# Patient Record
Sex: Female | Born: 1945
Health system: Southern US, Community
[De-identification: ages and names within clinical notes are randomized; demographics above are authoritative.]

## PROBLEM LIST (undated history)

## (undated) DIAGNOSIS — E785 Hyperlipidemia, unspecified: Secondary | ICD-10-CM

## (undated) DIAGNOSIS — R739 Hyperglycemia, unspecified: Secondary | ICD-10-CM

## (undated) DIAGNOSIS — K219 Gastro-esophageal reflux disease without esophagitis: Secondary | ICD-10-CM

## (undated) DIAGNOSIS — R9431 Abnormal electrocardiogram [ECG] [EKG]: Secondary | ICD-10-CM

## (undated) DIAGNOSIS — I2510 Atherosclerotic heart disease of native coronary artery without angina pectoris: Secondary | ICD-10-CM

## (undated) DIAGNOSIS — I10 Essential (primary) hypertension: Secondary | ICD-10-CM

## (undated) DIAGNOSIS — M545 Low back pain: Secondary | ICD-10-CM

## (undated) DIAGNOSIS — I213 ST elevation (STEMI) myocardial infarction of unspecified site: Secondary | ICD-10-CM

## (undated) DIAGNOSIS — J3089 Other allergic rhinitis: Secondary | ICD-10-CM

## (undated) DIAGNOSIS — I519 Heart disease, unspecified: Secondary | ICD-10-CM

## (undated) DIAGNOSIS — R011 Cardiac murmur, unspecified: Secondary | ICD-10-CM

## (undated) DIAGNOSIS — K819 Cholecystitis, unspecified: Secondary | ICD-10-CM

## (undated) DIAGNOSIS — M810 Age-related osteoporosis without current pathological fracture: Principal | ICD-10-CM

## (undated) DIAGNOSIS — I4510 Unspecified right bundle-branch block: Secondary | ICD-10-CM

## (undated) DIAGNOSIS — Z720 Tobacco use: Secondary | ICD-10-CM

## (undated) HISTORY — DX: Gastro-esophageal reflux disease without esophagitis: K21.9

## (undated) HISTORY — DX: Essential (primary) hypertension: I10

## (undated) HISTORY — DX: Low back pain: M54.5

## (undated) HISTORY — DX: Cholecystitis, unspecified: K81.9

## (undated) HISTORY — DX: Age-related osteoporosis without current pathological fracture: M81.0

## (undated) HISTORY — PX: LAPAROSCOPIC CHOLECYSTECTOMY: SUR755

## (undated) HISTORY — DX: Other allergic rhinitis: J30.89

## (undated) HISTORY — DX: Cardiac murmur, unspecified: R01.1

## (undated) HISTORY — DX: Abnormal electrocardiogram (ECG) (EKG): R94.31

---

## 1981-10-27 HISTORY — PX: ABDOMINAL HYSTERECTOMY: SHX81

## 2003-05-31 ENCOUNTER — Other Ambulatory Visit: Admission: RE | Admit: 2003-05-31 | Discharge: 2003-05-31 | Payer: Self-pay | Admitting: Obstetrics & Gynecology

## 2004-07-18 ENCOUNTER — Other Ambulatory Visit: Admission: RE | Admit: 2004-07-18 | Discharge: 2004-07-18 | Payer: Self-pay | Admitting: Obstetrics & Gynecology

## 2005-02-21 ENCOUNTER — Ambulatory Visit: Payer: Self-pay | Admitting: Internal Medicine

## 2008-03-17 ENCOUNTER — Observation Stay (HOSPITAL_COMMUNITY): Admission: EM | Admit: 2008-03-17 | Discharge: 2008-03-18 | Payer: Self-pay | Admitting: Emergency Medicine

## 2008-03-17 ENCOUNTER — Encounter (INDEPENDENT_AMBULATORY_CARE_PROVIDER_SITE_OTHER): Payer: Self-pay | Admitting: *Deleted

## 2010-01-02 ENCOUNTER — Ambulatory Visit: Payer: Self-pay | Admitting: Internal Medicine

## 2010-01-02 DIAGNOSIS — I10 Essential (primary) hypertension: Secondary | ICD-10-CM | POA: Insufficient documentation

## 2010-01-02 DIAGNOSIS — M545 Low back pain, unspecified: Secondary | ICD-10-CM

## 2010-01-02 DIAGNOSIS — K219 Gastro-esophageal reflux disease without esophagitis: Secondary | ICD-10-CM | POA: Insufficient documentation

## 2010-01-02 DIAGNOSIS — J3089 Other allergic rhinitis: Secondary | ICD-10-CM

## 2010-01-02 HISTORY — DX: Low back pain, unspecified: M54.50

## 2010-01-03 ENCOUNTER — Encounter: Payer: Self-pay | Admitting: Internal Medicine

## 2010-01-04 ENCOUNTER — Encounter: Payer: Self-pay | Admitting: Internal Medicine

## 2010-02-05 ENCOUNTER — Ambulatory Visit: Payer: Self-pay | Admitting: Internal Medicine

## 2010-11-24 LAB — CONVERTED CEMR LAB
AST: 20 units/L (ref 0–37)
BUN: 11 mg/dL (ref 6–23)
Basophils Relative: 0 % (ref 0.0–3.0)
Bilirubin Urine: NEGATIVE
Bilirubin, Direct: 0.1 mg/dL (ref 0.0–0.3)
Creatinine, Ser: 0.8 mg/dL (ref 0.4–1.2)
Eosinophils Absolute: 0.2 10*3/uL (ref 0.0–0.7)
Eosinophils Relative: 2.8 % (ref 0.0–5.0)
GFR calc non Af Amer: 77.11 mL/min (ref >60)
Glucose, Bld: 104 mg/dL — ABNORMAL HIGH (ref 70–99)
HCT: 42.1 % (ref 36.0–46.0)
Hemoglobin: 13.9 g/dL (ref 12.0–15.0)
Lymphocytes Relative: 24.9 % (ref 12.0–46.0)
Lymphs Abs: 1.9 10*3/uL (ref 0.7–4.0)
Neutro Abs: 5.2 10*3/uL (ref 1.4–7.7)
Neutrophils Relative %: 65.8 % (ref 43.0–77.0)
Pap Smear: NORMAL
RBC: 4.5 M/uL (ref 3.87–5.11)
Urine Glucose: NEGATIVE mg/dL
WBC: 7.8 10*3/uL (ref 4.5–10.5)

## 2010-11-26 NOTE — Letter (Signed)
Summary: Results Follow-up Letter  [facility]  [address] [place], [place] [address]   Phone: [phone]  Fax: [phone]    01/03/2010  [address] [place], [place]  [address]  Dear Ms. Orvan Falconer,   The following are the results of your recent test(s):  Test     Result     Liver/kidney   normal CBC       normal Thyroid     normal Urine       trace of infection  _________________________________________________________  Please call for an appointment soon _________________________________________________________ _________________________________________________________ _________________________________________________________  Sincerely,  Sanda Linger MD [facility]

## 2010-11-26 NOTE — Letter (Signed)
Summary: Referral - not able to see patient  [facility]  [address] [place], [place] [address]   Phone: [phone]  Fax: [phone]    January 04, 2010   Sanda Linger, M.D. [address] [place], [place] [address]    Re:   Melissa Nixon DOB:  1946-05-10 MRN:   [id number]    Dear Dr. Yetta Barre:  Thank you for your kind referral of the above patient.  We have attempted to schedule the recommended procedure Screening Colonoscopy but have not been able to schedule because:  ___ The patient was not available by phone and/or has not returned our calls.   X  The patient declined to schedule the procedure at this time.  We appreciate the referral and hope that we will have the opportunity to treat this patient in the future.    Sincerely,    [facility] [phone]

## 2010-11-26 NOTE — Assessment & Plan Note (Signed)
Summary: new pt/hypertention per dentist/[employer]/lb   Vital Signs:  Patient profile:   65 year old female Height:      67 inches Weight:      108.13 pounds BMI:     17.00 O2 Sat:      96 % on Room air Temp:     97.8 degrees F oral Pulse rate:   96 / minute Pulse rhythm:   regular Resp:     16 per minute BP supine:   196 / 110  (right arm) BP sitting:   168 / 102  (left arm) Cuff size:   large  Vitals Entered By: Rock Nephew CMA (January 02, 2010 4:14 PM)  O2 Flow:  Room air CC: New to establish/ discuss elevated blood pressure, Preventive Care, Hypertension Management   Primary Care Provider:  Etta Grandchild MD  CC:  New to establish/ discuss elevated blood pressure, Preventive Care, and Hypertension Management.  History of Present Illness: New to me for eval. and treatment  of hypertension. She says that she saw Dr. Tenny Craw years ago and had a chest pain, murmur, "hole" in the left ventricle work-up that was negative. She takes calritin-d and uses afrin ns for runny nose and aleve for LBP.  Hypertension History:      She denies headache, chest pain, palpitations, dyspnea with exertion, orthopnea, PND, peripheral edema, visual symptoms, neurologic problems, and syncope.        Positive major cardiovascular risk factors include female age 30 years old or older, hypertension, and current tobacco user.  Negative major cardiovascular risk factors include no history of diabetes or hyperlipidemia and negative family history for ischemic heart disease.        Positive history for target organ damage include cardiac end organ damage (either CHF or LVH).  Further assessment for target organ damage reveals no history of ASHD, stroke/TIA, peripheral vascular disease, renal insufficiency, or hypertensive retinopathy.     Preventive Screening-Counseling & Management  Alcohol-Tobacco     Alcohol drinks/day: 0     Alcohol Counseling: not indicated; patient does not drink     Smoking Status:  current     Smoking Cessation Counseling: yes     Smoke Cessation Stage: precontemplative     Packs/Day: 1.0     Pack years: 50  Caffeine-Diet-Exercise     Does Patient Exercise: no      Drug Use:  no.    Medications Prior to Update: 1)  None  Current Medications (verified): 1)  None  Allergies (verified): No Known Drug Allergies  Past History:  Past Medical History: GERD Murmur Hypertension Low back pain  Past Surgical History: Hysterectomy Cholecystectomy  Family History: Family History Diabetes 1st degree relative Family History Hypertension  Social History: Married Current Smoker Alcohol use-no Drug use-no Regular exercise-no Occupation: Airline pilot Smoking Status:  current Drug Use:  no Does Patient Exercise:  no Packs/Day:  1.0  Review of Systems       The patient complains of weight gain and dyspnea on exertion.  The patient denies anorexia, fever, weight loss, chest pain, syncope, peripheral edema, prolonged cough, headaches, hemoptysis, abdominal pain, melena, hematochezia, severe indigestion/heartburn, hematuria, incontinence, suspicious skin lesions, and enlarged lymph nodes.   ENT:  Complains of decreased hearing, nasal congestion, and postnasal drainage; denies difficulty swallowing, ear discharge, earache, hoarseness, nosebleeds, ringing in ears, sinus pressure, and sore throat. CV:  Denies bluish discoloration of lips or nails, chest pain or discomfort, difficulty breathing at night, fainting,  fatigue, leg cramps with exertion, lightheadness, near fainting, palpitations, shortness of breath with exertion, swelling of feet, swelling of hands, and weight gain. Endo:  Complains of heat intolerance; denies cold intolerance, excessive hunger, excessive thirst, excessive urination, polyuria, and weight change.  Physical Exam  General:  alert, well-developed, well-nourished, well-hydrated, appropriate dress, normal appearance, healthy-appearing, good  hygiene, and overweight-appearing.   Head:  normocephalic, atraumatic, no abnormalities observed, and no abnormalities palpated.   Eyes:  vision grossly intact, pupils equal, pupils round, pupils reactive to light, pupils react to accomodation, no injection, and a-v nicking.   Ears:  R ear normal and L ear normal.   Mouth:  Oral mucosa and oropharynx without lesions or exudates.  Teeth in good repair. Neck:  supple, full ROM, no masses, no thyromegaly, no thyroid nodules or tenderness, no JVD, no carotid bruits, no cervical lymphadenopathy, and no neck tenderness.   Lungs:  normal respiratory effort, no intercostal retractions, no accessory muscle use, normal breath sounds, no dullness, no fremitus, no crackles, and no wheezes.   Heart:  normal rate, regular rhythm, no gallop, no rub, no JVD, and Grade  1/6 systolic ejection murmur.   Abdomen:  soft, non-tender, normal bowel sounds, no distention, no masses, no guarding, no rigidity, no rebound tenderness, no abdominal hernia, no inguinal hernia, no hepatomegaly, and no splenomegaly.   Msk:  normal ROM, no joint tenderness, no joint swelling, no joint warmth, no redness over joints, no joint deformities, no joint instability, no crepitation, and no muscle atrophy.   Pulses:  R and L carotid,radial,femoral,dorsalis pedis and posterior tibial pulses are full and equal bilaterally Extremities:  No clubbing, cyanosis, edema, or deformity noted with normal full range of motion of all joints.   Neurologic:  No cranial nerve deficits noted. Station and gait are normal. Plantar reflexes are down-going bilaterally. DTRs are symmetrical throughout. Sensory, motor and coordinative functions appear intact. Skin:  turgor normal, color normal, no rashes, no suspicious lesions, no ecchymoses, no petechiae, no purpura, no ulcerations, and no edema.   Cervical Nodes:  no anterior cervical adenopathy and no posterior cervical adenopathy.   Axillary Nodes:  no R  axillary adenopathy and no L axillary adenopathy.   Inguinal Nodes:  no R inguinal adenopathy and no L inguinal adenopathy.   Psych:  Cognition and judgment appear intact. Alert and cooperative with normal attention span and concentration. No apparent delusions, illusions, hallucinations Additional Exam:  EKG has motion artifact but shows Sinus rhythm and LVH. There are no q waves and no acute st/t wave changes.   Impression & Recommendations:  Problem # 1:  HYPERTENSION (ICD-401.9) Assessment New stop decongestants and nsaids, will order labs to look for secondary causes BP today: 168/102  Orders: Venipuncture ([address]) TLB-BMP (Basic Metabolic Panel-BMET) (80048-METABOL) TLB-CBC Platelet - w/Differential (85025-CBCD) TLB-Hepatic/Liver Function Pnl (80076-HEPATIC) TLB-TSH (Thyroid Stimulating Hormone) (84443-TSH) TLB-Udip w/ Micro (81001-URINE) EKG w/ Interpretation (93000)  Her updated medication list for this problem includes:    Diovan Hct 160-12.5 Mg Tabs (Valsartan-hydrochlorothiazide) ..... One by mouth once daily for high blood pressure  Problem # 2:  ELECTROCARDIOGRAM, ABNORMAL (ICD-794.31) Assessment: New  will order med records from dr. Tenny Craw and advise further  Orders: EKG w/ Interpretation (93000)  Problem # 3:  ALLERGIC RHINITIS DUE TO OTHER ALLERGEN (ICD-477.8) Assessment: New  start veramyst, use plain claritin  Orders: EKG w/ Interpretation (93000)  Complete Medication List: 1)  Diovan Hct 160-12.5 Mg Tabs (Valsartan-hydrochlorothiazide) .... One by mouth once daily for  high blood pressure 2)  Veramyst 27.5 Mcg/spray Susp (Fluticasone furoate) .... 2 puffs each nostril once daily  Other Orders: Gastroenterology Referral (GI)  Hypertension Assessment/Plan:      The patient's hypertensive risk group is category C: Target organ damage and/or diabetes.  Today's blood pressure is 168/102.  Her blood pressure goal is < 140/90.  Colorectal  Screening:  Current Recommendations:    Colonoscopy recommended: scheduled with [facility].I.  PAP Screening:    Hx Cervical Dysplasia in last 5 yrs? No    3 normal PAP smears in last 5 yrs? Yes    Last PAP smear:  03/08/2008  PAP Smear Results:    Date of Exam:  03/08/2008    Results:  Normal  Mammogram Screening:    Last Mammogram:  03/21/2008  Mammogram Results:    Date of Exam:  03/21/2008    Results:  Normal Bilateral  Osteoporosis Risk Assessment:  Risk Factors for Fracture or Low Bone Density:   Smoking status:       current  Patient Instructions: 1)  Please schedule a follow-up appointment in 1 month. 2)  Tobacco is very bad for your health and your loved ones! You Should stop smoking!. 3)  Stop Smoking Tips: Choose a Quit date. Cut down before the Quit date. decide what you will do as a substitute when you feel the urge to smoke(gum,toothpick,exercise). 4)  Check your Blood Pressure regularly. If it is above 140/90: you should make an appointment. Prescriptions: VERAMYST 27.5 MCG/SPRAY SUSP (FLUTICASONE FUROATE) 2 puffs each nostril once daily  #2 inhs. x 0   Entered and Authorized by:   Etta Grandchild MD   Signed by:   Etta Grandchild MD on 01/02/2010   Method used:   Samples Given   RxID:   [id number] DIOVAN HCT 160-12.5 MG TABS (VALSARTAN-HYDROCHLOROTHIAZIDE) One by mouth once daily for high blood pressure  #84 x 0   Entered and Authorized by:   Etta Grandchild MD   Signed by:   Etta Grandchild MD on 01/02/2010   Method used:   Samples Given   RxID:   [id number]

## 2010-11-26 NOTE — Assessment & Plan Note (Signed)
Summary: ONE MONTH FOLLOW UP-LB   Vital Signs:  Patient profile:   65 year old female Height:      67 inches Weight:      178 pounds BMI:     27.98 O2 Sat:      96 % on Room air Temp:     97.8 degrees F oral Pulse rate:   100 / minute Pulse rhythm:   regular Resp:     16 per minute BP sitting:   140 / 68  (left arm) Cuff size:   large  Vitals Entered By: Rock Nephew CMA (February 05, 2010 8:10 AM)  Nutrition Counseling: Patient's BMI is greater than 25 and therefore counseled on weight management options.  O2 Flow:  Room air CC: follow-up visit, , URI symptoms   Primary Care Provider:  Etta Grandchild MD  CC:  follow-up visit, , and URI symptoms.  History of Present Illness:  URI Symptoms      This is a 65 year old woman who presents with URI symptoms.  The symptoms began 5 days ago.  The severity is described as moderate.  The patient reports sore throat, productive cough, and sick contacts, but denies purulent nasal discharge and earache.  The patient denies fever, stiff neck, dyspnea, wheezing, rash, vomiting, diarrhea, use of an antipyretic, and response to antipyretic.  The patient denies sneezing, seasonal symptoms, headache, muscle aches, and severe fatigue.  The patient denies the following risk factors for Strep sinusitis: unilateral facial pain, unilateral nasal discharge, poor response to decongestant, double sickening, tooth pain, Strep exposure, tender adenopathy, and absence of cough.    Dyspepsia History:      She has no alarm features of dyspepsia including no history of melena, hematochezia, dysphagia, persistent vomiting, or involuntary weight loss > 5%.  There is a prior history of GERD.  The patient does not have a prior history of documented ulcer disease.  The dominant symptom is heartburn or acid reflux.  An H-2 blocker medication is not currently being taken.     Preventive Screening-Counseling & Management  Alcohol-Tobacco     Alcohol drinks/day: 0    Alcohol Counseling: not indicated; patient does not drink     Smoking Status: current     Smoking Cessation Counseling: yes     Smoke Cessation Stage: contemplative     Packs/Day: 1.0     Pack years: 50  Hep-HIV-STD-Contraception     Hepatitis Risk: no risk noted     HIV Risk: no risk noted     STD Risk: no risk noted  Current Medications (verified): 1)  Diovan Hct 160-12.5 Mg Tabs (Valsartan-Hydrochlorothiazide) .... One By Mouth Once Daily For High Blood Pressure 2)  Veramyst 27.5 Mcg/spray Susp (Fluticasone Furoate) .... 2 Puffs Each Nostril Once Daily  Allergies (verified): No Known Drug Allergies  Past History:  Past Medical History: Reviewed history from 01/02/2010 and no changes required. GERD Murmur Hypertension Low back pain  Past Surgical History: Reviewed history from 01/02/2010 and no changes required. Hysterectomy Cholecystectomy  Family History: Reviewed history from 01/02/2010 and no changes required. Family History Diabetes 1st degree relative Family History Hypertension  Social History: Reviewed history from 01/02/2010 and no changes required. Married Current Smoker Alcohol use-no Drug use-no Regular exercise-no Occupation: accountant Hepatitis Risk:  no risk noted HIV Risk:  no risk noted STD Risk:  no risk noted  Review of Systems       The patient complains of severe indigestion/heartburn.  The  patient denies abdominal pain, melena, and hematochezia.    Physical Exam  General:  alert, well-developed, well-nourished, well-hydrated, appropriate dress, normal appearance, healthy-appearing, good hygiene, and overweight-appearing.   Head:  normocephalic, atraumatic, no abnormalities observed, and no abnormalities palpated.   Mouth:  Oral mucosa and oropharynx without lesions or exudates.  Teeth in good repair. Neck:  supple, full ROM, no masses, no thyromegaly, no thyroid nodules or tenderness, no JVD, no carotid bruits, no cervical  lymphadenopathy, and no neck tenderness.   Lungs:  normal respiratory effort, no intercostal retractions, no accessory muscle use, normal breath sounds, no dullness, no fremitus, no crackles, and no wheezes.   Heart:  normal rate, regular rhythm, no gallop, no rub, no JVD, and Grade  1/6 systolic ejection murmur.   Abdomen:  soft, non-tender, normal bowel sounds, no distention, no masses, no guarding, no rigidity, no rebound tenderness, no abdominal hernia, no inguinal hernia, no hepatomegaly, and no splenomegaly.   Msk:  normal ROM, no joint tenderness, no joint swelling, no joint warmth, no redness over joints, no joint deformities, no joint instability, no crepitation, and no muscle atrophy.   Pulses:  R and L carotid,radial,femoral,dorsalis pedis and posterior tibial pulses are full and equal bilaterally Extremities:  No clubbing, cyanosis, edema, or deformity noted with normal full range of motion of all joints.   Neurologic:  No cranial nerve deficits noted. Station and gait are normal. Plantar reflexes are down-going bilaterally. DTRs are symmetrical throughout. Sensory, motor and coordinative functions appear intact. Skin:  turgor normal, color normal, no rashes, no suspicious lesions, no ecchymoses, no petechiae, no purpura, no ulcerations, and no edema.   Cervical Nodes:  no anterior cervical adenopathy and no posterior cervical adenopathy.   Axillary Nodes:  no R axillary adenopathy and no L axillary adenopathy.   Inguinal Nodes:  no R inguinal adenopathy and no L inguinal adenopathy.   Psych:  Cognition and judgment appear intact. Alert and cooperative with normal attention span and concentration. No apparent delusions, illusions, hallucinations   Impression & Recommendations:  Problem # 1:  COUGH (ICD-786.2) Assessment New  Orders: T-2 View CXR (71020TC) Tobacco use cessation intermediate 3-10 minutes (62130)  Problem # 2:  TOBACCO USE (ICD-305.1) Assessment: Unchanged  Her  updated medication list for this problem includes:    Chantix Starting Month Pak 0.5 Mg X 11 & 1 Mg X 42 Tabs (Varenicline tartrate) .Marland Kitchen... Take as directed  Encouraged smoking cessation and discussed different methods for smoking cessation.   Orders: Tobacco use cessation intermediate 3-10 minutes (86578)  Problem # 3:  HYPERTENSION (ICD-401.9) Assessment: Improved  Her updated medication list for this problem includes:    Diovan Hct 160-12.5 Mg Tabs (Valsartan-hydrochlorothiazide) ..... One by mouth once daily for high blood pressure  BP today: 140/68 Prior BP: 196/110 (01/02/2010)  Prior 10 Yr Risk Heart Disease: Not enough information (01/02/2010)  Labs Reviewed: K+: 3.8 (01/02/2010) Creat: : 0.8 (01/02/2010)     Problem # 4:  GERD (ICD-530.81) Assessment: Deteriorated  Her updated medication list for this problem includes:    Dexilant 60 Mg Cpdr (Dexlansoprazole) ..... One by mouth once daily for heartburn  Problem # 5:  BRONCHITIS-ACUTE (ICD-466.0) Assessment: New  Her updated medication list for this problem includes:    Avelox 400 Mg Tabs (Moxifloxacin hcl) ..... One by mouth once daily for 7 days  Take antibiotics and other medications as directed. Encouraged to push clear liquids, get enough rest, and take acetaminophen as needed. To be  seen in 5-7 days if no improvement, sooner if worse.  Orders: Tobacco use cessation intermediate 3-10 minutes (99406)  Complete Medication List: 1)  Diovan Hct 160-12.5 Mg Tabs (Valsartan-hydrochlorothiazide) .... One by mouth once daily for high blood pressure 2)  Veramyst 27.5 Mcg/spray Susp (Fluticasone furoate) .... 2 puffs each nostril once daily 3)  Chantix Starting Month Pak 0.5 Mg X 11 & 1 Mg X 42 Tabs (Varenicline tartrate) .... Take as directed 4)  Avelox 400 Mg Tabs (Moxifloxacin hcl) .... One by mouth once daily for 7 days 5)  Dexilant 60 Mg Cpdr (Dexlansoprazole) .... One by mouth once daily for heartburn  Patient  Instructions: 1)  Avoid foods high in acid (tomatoes, citrus juices, spicy foods). Avoid eating within two hours of lying down or before exercising. Do not over eat; try smaller more frequent meals. Elevate head of bed twelve inches when sleeping. 2)  Tobacco is very bad for your health and your loved ones! You Should stop smoking!. 3)  Stop Smoking Tips: Choose a Quit date. Cut down before the Quit date. decide what you will do as a substitute when you feel the urge to smoke(gum,toothpick,exercise). 4)  It is important that you exercise regularly at least 20 minutes 5 times a week. If you develop chest pain, have severe difficulty breathing, or feel very tired , stop exercising immediately and seek medical attention. 5)  You need to lose weight. Consider a lower calorie diet and regular exercise.  6)  Check your Blood Pressure regularly. If it is above: you should make an appointment. 7)  Take your antibiotic as prescribed until ALL of it is gone, but stop if you develop a rash or swelling and contact our office as soon as possible. 8)  Acute bronchitis symptoms for less than 10 days are not helped by antibiotics. take over the counter cough medications. call if no improvment in  5-7 days, sooner if increasing cough, fever, or new symptoms( shortness of breath, chest pain). 9)  Please schedule a follow-up appointment in 1 month. Prescriptions: DEXILANT 60 MG CPDR (DEXLANSOPRAZOLE) One by mouth once daily for heartburn  #50 x 0   Entered and Authorized by:   Etta Grandchild MD   Signed by:   Etta Grandchild MD on 02/05/2010   Method used:   Samples Given   RxID:   [id number] AVELOX 400 MG TABS (MOXIFLOXACIN HCL) One by mouth once daily for 7 days  #7 x 0   Entered and Authorized by:   Etta Grandchild MD   Signed by:   Etta Grandchild MD on 02/05/2010   Method used:   Samples Given   RxID:   [id number] DIOVAN HCT 160-12.5 MG TABS (VALSARTAN-HYDROCHLOROTHIAZIDE) One by mouth once daily  for high blood pressure  #30 x 11   Entered and Authorized by:   Etta Grandchild MD   Signed by:   Etta Grandchild MD on 02/05/2010   Method used:   Print then Give to Patient   RxID:   [id number] CHANTIX STARTING MONTH PAK 0.5 MG X 11 & 1 MG X 42 TABS (VARENICLINE TARTRATE) take as directed  #1 x 0   Entered and Authorized by:   Etta Grandchild MD   Signed by:   Etta Grandchild MD on 02/05/2010   Method used:   Print then Give to Patient   RxID:   [id number]

## 2011-03-11 NOTE — Op Note (Signed)
Melissa Nixon, Melissa Nixon              ACCOUNT NO.:  [id number]   MEDICAL RECORD NO.:  [id number]          PATIENT TYPE:  OBV   LOCATION:  0106                         FACILITY:  [facility]   PHYSICIAN:  Alfonse Ras, MD   DATE OF BIRTH:  03-11-1946   DATE OF PROCEDURE:  03/17/2008  DATE OF DISCHARGE:                               OPERATIVE REPORT   PREOPERATIVE DIAGNOSIS:  Acute cholecystitis.   POSTOPERATIVE DIAGNOSIS:  Acute gangrenous cholecystitis.   PROCEDURE:  Laparoscopic cholecystectomy with intraoperative  cholangiogram.   FINDINGS:  Gangrenous gallbladder and normal intraoperative  cholangiogram.   SURGEON:  Alfonse Ras, MD.   ASSISTANT:  Anselm Pancoast. Zachery Dakins, M.D.   ANESTHESIA:  General.   DESCRIPTION:  The patient was taken to the operating room after informed  consent was obtained, the discussion with the patient and her husband  for laparoscopic cholecystectomy.  She was placed in the supine position  and adequate general anesthesia was induced using endotracheal tube.  The abdomen was prepped and draped in the normal sterile fashion.  A  transverse infraumbilical incision was made.  I dissected down to the  fascia.  Fascia was opened vertically.  An 0-Vicryl pursestring suture  was placed on the fascial defect and a Hasson trocar was placed in the  abdomen.  Pneumoperitoneum was obtained and under direct vision an 11 mm  trocar was placed in subxiphoid region.  Two 5 mm trocars were placed in  the right abdomen.  The gallbladder was identified, was very firm, had a  number of adhesions and omental caking to it.  It was aspirated with the  cyst aspirator.  It still was very firm and quite edematous.  Tedious  dissection was undertaken which revealed a very large impaction stones  in the neck of the gallbladder.  The neck was retracted laterally.  A  tedious dissection was undertaken to mobilize the gallbladder and obtain  a critical view of the cystic duct.   This was obtained.  It was clipped  proximally up on the gallbladder.  Small ductotomy was made.  Reddick  catheter was then introduced and cholangiogram was performed without  difficulty.  It showed normal flow into duodenum, normal flowing of the  common bile duct and right and left hepatic ducts.  Cholangiocatheter  was removed and cystic duct was triply clipped and divided.  Cystic  artery was dissected in similar fashion, triply clipped and divided.  The gallbladder was difficult to dissect off the gallbladder bed that  was accomplished using Bovie electrocautery.  It was placed in EndoCatch  bag and adequate hemostasis was ensured with the Surgicel and Bovie  electrocautery.  The gallbladder was too tense to remove through the  umbilical incision previously made and therefore the fascial incision  was extended.  The gallbladder was opened and number of stones were  removed with a stone forceps.  This allowed the gallbladder within the  bag to be delivered through the wound.  The wound was irrigated.  Additional figure-of-eight Vicryl suture was utilized to close the  remainder of the fascial  defects.  This was an airtight seal.  Right upper quadrant was copiously  irrigated.  Pneumoperitoneum was released.  Trocars were removed.  Skin  incisions were closed with 4-0 Monocryl.  Steri-Strips and sterile  dressings were applied.  The patient tolerated the procedure well went  to PACU in good condition.      Alfonse Ras, MD  Electronically Signed     KRE/MEDQ  D:  03/17/2008  T:  03/17/2008  Job:  [id number]

## 2011-03-11 NOTE — H&P (Signed)
NAMEMARIELY, Melissa Nixon              ACCOUNT NO.:  [id number]   MEDICAL RECORD NO.:  [id number]          PATIENT TYPE:  EMS   LOCATION:  ED                           FACILITY[facility]  [facility]   PHYSICIAN:  Alfonse Ras, MD   DATE OF BIRTH:  12/04/1945   DATE OF ADMISSION:  03/17/2008  DATE OF DISCHARGE:                              HISTORY & PHYSICAL   ADMISSION DIAGNOSIS:  Acute cholecystitis.   HISTORY OF PRESENT ILLNESS:  The patient is a very pleasant 65 year old  white female with a 3-day history of worsening right upper quadrant  abdominal pain, some mild nausea.  Since being in the emergency room,  her pain has improved with the administration of some morphine.  Ultrasound showed a thickened gallbladder wall and gallstones, elevated  white count of 22,000, and normal liver function test.   REVIEW OF SYSTEMS:  Significant as above.  Negative for acholic stools  and dark urine.   PHYSICAL EXAMINATION:  GENERAL:  She is an age-appropriate white female  in no distress.  VITAL SIGNS:  Temperature is 98, heart rate is 110, respiratory rate is  20, and blood pressure is 164/72.  HEENT EXAM:  Benign.  Normocephalic, atraumatic.  Sclerae, nonicteric.  NECK:  Supple and soft out mass or thyromegaly, or cervical adenopathy.  LUNGS:  Clear to auscultation with some mild and expiratory wheezing.  ABDOMEN:  Soft, but moderately tender in the right upper quadrant.  EXTREMITIES:  Show no clubbing, cyanosis, or edema.  SKIN:  No lesions, rashes, or ulceration.   IMPRESSION:  Symptomatic acute cholecystitis.   PLAN:  Admission IV antibiotics and laparoscopic cholecystectomy within  the next 24 hours.      Alfonse Ras, MD  Electronically Signed     KRE/MEDQ  D:  03/17/2008  T:  03/17/2008  Job:  [id number]

## 2011-07-23 LAB — CBC
HCT: 35.9 — ABNORMAL LOW
HCT: 42.5
Hemoglobin: 12.4
MCV: 91.3
MCV: 92.4
Platelets: 338
RBC: 4.66
WBC: 13.8 — ABNORMAL HIGH
WBC: 22.5 — ABNORMAL HIGH

## 2011-07-23 LAB — URINALYSIS, ROUTINE W REFLEX MICROSCOPIC
Glucose, UA: NEGATIVE
Ketones, ur: 40 — AB
Nitrite: POSITIVE — AB
Protein, ur: 30 — AB

## 2011-07-23 LAB — DIFFERENTIAL
Basophils Absolute: 0.1
Lymphocytes Relative: 5 — ABNORMAL LOW
Neutro Abs: 19.5 — ABNORMAL HIGH

## 2011-07-23 LAB — PROTIME-INR: INR: 1

## 2011-07-23 LAB — COMPREHENSIVE METABOLIC PANEL
BUN: 6
CO2: 25
Chloride: 97
Creatinine, Ser: 0.69
GFR calc non Af Amer: >60
Total Bilirubin: 1.6 — ABNORMAL HIGH

## 2011-07-23 LAB — LIPASE, BLOOD: Lipase: 16

## 2012-06-03 ENCOUNTER — Encounter: Payer: Self-pay | Admitting: *Deleted

## 2012-06-03 ENCOUNTER — Ambulatory Visit (INDEPENDENT_AMBULATORY_CARE_PROVIDER_SITE_OTHER): Payer: Medicare HMO | Admitting: Cardiovascular Disease

## 2012-06-03 ENCOUNTER — Encounter: Payer: Self-pay | Admitting: Cardiovascular Disease

## 2012-06-03 VITALS — BP 180/99 | HR 95 | Wt 174.0 lb

## 2012-06-03 DIAGNOSIS — R011 Cardiac murmur, unspecified: Secondary | ICD-10-CM

## 2012-06-03 DIAGNOSIS — F17200 Nicotine dependence, unspecified, uncomplicated: Secondary | ICD-10-CM

## 2012-06-03 DIAGNOSIS — R9431 Abnormal electrocardiogram [ECG] [EKG]: Secondary | ICD-10-CM

## 2012-06-03 DIAGNOSIS — I10 Essential (primary) hypertension: Secondary | ICD-10-CM

## 2012-06-03 HISTORY — DX: Cardiac murmur, unspecified: R01.1

## 2012-06-03 MED ORDER — AMLODIPINE BESYLATE 10 MG PO TABS: 10.0000 mg | ORAL_TABLET | Freq: Every day | ORAL | Status: DC

## 2012-06-03 NOTE — Assessment & Plan Note (Signed)
Refer back to primary care.  Will refill Chantix and try to quit with husband who smokes and is a patient of Dr Shirlee Latch

## 2012-06-03 NOTE — Patient Instructions (Addendum)
PLEASE SET APP FOR HER ASAP PER DR NISHAN WITH [facility]/ SHE IS ESTABLISHED.  Your physician has recommended you make the following change in your medication: START NORVASC 10 MG DAILY  Your physician recommends that you schedule a follow-up appointment in: 4-5 WEEKS

## 2012-06-03 NOTE — Progress Notes (Signed)
Patient ID: Melissa Nixon, female   DOB: 11-07-1945, 66 y.o.   MRN: [id number] 66 yo previously seen by Dr Tenny Craw.  Referred by Dr Sherryle Lis eye doctor for HTN.  She use to see Dr Yetta Barre for primary care but not recently.  Still smoking.  Has tried chantix in past.  Was on diovan hctz with Dr Yetta Barre but stopped it  Because of headache and has not taken anything for BPin 2 years.  She had been on beta blocker with Dr Tenny Craw before and no problem with this.  Denies chest pain palpitations or dsypnea.  No cough.  No history of kidney disease or RAS.  Describes history of restrivtive VSD as child that has not been F/U  Poor diet with salt.  Denies excess ETOH.    ROS: Denies fever, malais, weight loss, blurry vision, decreased visual acuity, cough, sputum, SOB, hemoptysis, pleuritic pain, palpitaitons, heartburn, abdominal pain, melena, lower extremity edema, claudication, or rash.  All other systems reviewed and negative   General: Affect appropriate Desheveled female with nicotine on breath HEENT: normal Neck supple with no adenopathy JVP normal no bruits no thyromegaly Lungs clear with no wheezing and good diaphragmatic motion Heart:  S1/S2 systolic  murmur,rub, gallop or click PMI normal Abdomen: benighn, BS positve, no tenderness, no AAA no bruit.  No HSM or HJR Distal pulses intact with no bruits No edema Neuro non-focal Skin warm and dry No muscular weakness  Medications Current Outpatient Prescriptions  Medication Sig Dispense Refill  . Loratadine-Pseudoephedrine (CLARITIN-D 24 HOUR PO) Take 1 tablet by mouth daily.      . Naproxen Sodium (ALEVE) 220 MG CAPS Take 2 capsules by mouth daily.        Allergies Review of patient's allergies indicates no known allergies.  Family History: Family History  Problem Relation Age of Onset  . Diabetes    . Hypertension      Social History: History   Social History  . Marital Status: Married    Spouse Name: N/A    Number of Children: N/A  .  Years of Education: N/A   Occupational History  . Not on file.   Social History Main Topics  . Smoking status: Current Everyday Smoker  . Smokeless tobacco: Not on file  . Alcohol Use: No  . Drug Use: No  . Sexually Active: Not on file   Other Topics Concern  . Not on file   Social History Narrative  . No narrative on file    Electrocardiogram:  Assessment and Plan

## 2012-06-03 NOTE — Assessment & Plan Note (Signed)
Sounds like VSD has closed.  Systolic murmur.  F/U echo after on better Rx for HTN

## 2012-06-03 NOTE — Assessment & Plan Note (Signed)
Apparantly intolerant to diovan.  Will call in norvasc and F/U in 4 weeks as she is likely to need more than one drug.

## 2012-06-03 NOTE — Assessment & Plan Note (Signed)
Reviewed ecg from today NSR rate 95 LAE LAFB LVH all likely from HTN

## 2012-06-04 ENCOUNTER — Telehealth: Payer: Self-pay | Admitting: Internal Medicine

## 2012-06-04 NOTE — Telephone Encounter (Signed)
Message copied by Etheleen Sia on Fri Jun 04, 2012  9:39 AM ------      Message from: Etta Grandchild      Created: Thu Jun 03, 2012  5:19 PM      Regarding: RE: SWITCH PCP       Yes, she can change doctors      ----- Message -----         From: Etheleen Sia         Sent: 06/03/2012   3:37 PM           To: Corwin Levins, MD, Etheleen Sia, #      Subject: [facility] PCP                                               Patient needs to be seen soon per Dr. Eden Emms.  He has started her on Norvasc.        She saw Dr Yetta Barre last in 2011 but wants to switch PCP.  She has [employer].  Is it ok to switch?                        Call patient and let Jasmine December at cardiology know.

## 2012-06-04 NOTE — Telephone Encounter (Signed)
Message copied by Etheleen Sia on Fri Jun 04, 2012  9:38 AM ------      Message from: Corwin Levins      Created: Thu Jun 03, 2012  3:51 PM      Regarding: RE: SWITCH PCP       Sorry, I really have a full panel at this time      ----- Message -----         From: Etheleen Sia         Sent: 06/03/2012   3:37 PM           To: Corwin Levins, MD, Etheleen Sia, #      Subject: [facility] PCP                                               Patient needs to be seen soon per Dr. Eden Emms.  He has started her on Norvasc.        She saw Dr Yetta Barre last in 2011 but wants to switch PCP.  She has [employer].  Is it ok to switch?                        Call patient and let Jasmine December at cardiology know.

## 2012-06-04 NOTE — Telephone Encounter (Signed)
Cardiology has been notified

## 2012-06-08 ENCOUNTER — Telehealth: Payer: Self-pay | Admitting: Cardiovascular Disease

## 2012-06-08 MED ORDER — ATENOLOL 50 MG PO TABS: 50.0000 mg | ORAL_TABLET | Freq: Every day | ORAL | Status: DC

## 2012-06-08 NOTE — Telephone Encounter (Signed)
PT NOTED EDEMA  BIL TO FEET AND ANKLES YESTERDAY  FEET AND ANKLES WERE  OKAY THIS AM AND HAVE GRADUALLY INCREASED THROUGHOUT DAY AGAIN TODAY  WAS RECENTLY STARTED ON NORVASC LAST WEEK PT NOTED HAS TAKEN ATENOLOL  IN PAST FOR  B/P AND TOLERATED WILL FORWARD TO DR Eden Emms FOR REVIEW./CY

## 2012-06-08 NOTE — Telephone Encounter (Signed)
Pt is having a reaction to medication yesterday and today she has swelling in her lower extremities

## 2012-06-08 NOTE — Telephone Encounter (Signed)
Ok to start atenolol 50 mg and stop norvasc

## 2012-06-08 NOTE — Telephone Encounter (Signed)
PT AWARE TO STOP NORVASC AND START ATENOLOL 50 MG QD .Melissa Nixon

## 2012-07-07 ENCOUNTER — Telehealth: Payer: Self-pay | Admitting: Internal Medicine

## 2012-07-07 NOTE — Telephone Encounter (Signed)
Opened in error

## 2012-07-15 ENCOUNTER — Encounter: Payer: Self-pay | Admitting: Cardiovascular Disease

## 2012-07-15 ENCOUNTER — Other Ambulatory Visit: Payer: Self-pay | Admitting: *Deleted

## 2012-07-15 ENCOUNTER — Ambulatory Visit (INDEPENDENT_AMBULATORY_CARE_PROVIDER_SITE_OTHER): Payer: Medicare HMO | Admitting: Cardiovascular Disease

## 2012-07-15 VITALS — BP 166/94 | HR 73 | Ht 68.0 in | Wt 178.0 lb

## 2012-07-15 DIAGNOSIS — R9431 Abnormal electrocardiogram [ECG] [EKG]: Secondary | ICD-10-CM

## 2012-07-15 DIAGNOSIS — F17200 Nicotine dependence, unspecified, uncomplicated: Secondary | ICD-10-CM

## 2012-07-15 DIAGNOSIS — R011 Cardiac murmur, unspecified: Secondary | ICD-10-CM

## 2012-07-15 MED ORDER — VARENICLINE TARTRATE 1 MG PO TABS: 1.0000 mg | ORAL_TABLET | Freq: Two times a day (BID) | ORAL | Status: DC

## 2012-07-15 MED ORDER — LOSARTAN POTASSIUM 50 MG PO TABS: 50.0000 mg | ORAL_TABLET | Freq: Every day | ORAL | Status: DC

## 2012-07-15 NOTE — Patient Instructions (Signed)
Your physician recommends that you schedule a follow-up appointment in: 8-10 WEEKS WITH DR Sarah Bush [facility] Your physician has recommended you make the following change in your medication: ADD CHANTIX AS DIRECTED AND  LOSARTAN 50 MG EVERY DAY Your physician has requested that you have an echocardiogram. Echocardiography is a painless test that uses sound waves to create images of your heart. It provides your doctor with information about the size and shape of your heart and how well your heart's chambers and valves are working. This procedure takes approximately one hour. There are no restrictions for this procedure. DX MURMUR

## 2012-07-15 NOTE — Assessment & Plan Note (Signed)
Continue beta blocker add ARB  FU 8-10 weeks

## 2012-07-15 NOTE — Assessment & Plan Note (Signed)
History of restrictive VSD  SEM not like VSD  F/U echo

## 2012-07-15 NOTE — Assessment & Plan Note (Signed)
Due to LVH no change

## 2012-07-15 NOTE — Assessment & Plan Note (Signed)
Counseled for less than 10 minutes.  Chantix called in  

## 2012-07-15 NOTE — Addendum Note (Signed)
Addended by: Scherrie Bateman E on: 07/15/2012 01:17 PM   Modules accepted: Orders

## 2012-07-15 NOTE — Progress Notes (Signed)
Patient ID: Melissa Nixon, female   DOB: [birth year], 66 y.o.   MRN: [id number] 66 yo previously seen by Dr Tenny Craw. Referred by Dr Sherryle Lis eye doctor for HTN. She use to see Dr Yetta Barre for primary care but not recently. Still smoking. Has tried chantix in past. Was on diovan hctz with Dr Yetta Barre but stopped it Because of headache and has not taken anything for BPin 2 years. She had been on beta blocker with Dr Tenny Craw before and no problem with this. Denies chest pain palpitations or dsypnea. No cough. No history of kidney disease or RAS. 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 Denies excess ETOH.   Since I last saw her she had swelling with norvasc and is on a beta blocker.  BP still high Not started Chantix.  Not had echo   ROS: Denies fever, malais, weight loss, blurry vision, decreased visual acuity, cough, sputum, SOB, hemoptysis, pleuritic pain, palpitaitons, heartburn, abdominal pain, melena, lower extremity edema, claudication, or rash.  All other systems reviewed and negative  General: Affect appropriate Healthy:  appears stated age HEENT: normal Neck supple with no adenopathy JVP normal no bruits no thyromegaly Lungs clear with no wheezing and good diaphragmatic motion Heart:  S1/S2 SEM   murmur, no rub, gallop or click PMI normal Abdomen: benighn, BS positve, no tenderness, no AAA no bruit.  No HSM or HJR Distal pulses intact with no bruits No edema Neuro non-focal Skin warm and dry No muscular weakness   Current Outpatient Prescriptions  Medication Sig Dispense Refill  . atenolol (TENORMIN) 50 MG tablet Take 1 tablet (50 mg total) by mouth daily.  30 tablet  11  . Loratadine-Pseudoephedrine (CLARITIN-D 24 HOUR PO) Take 1 tablet by mouth daily.      . Naproxen Sodium (ALEVE) 220 MG CAPS Take 2 capsules by mouth daily.        Allergies  Review of patient's allergies indicates no known allergies.  Electrocardiogram:  Assessment and  Plan

## 2012-07-16 ENCOUNTER — Encounter: Payer: Self-pay | Admitting: Family Medicine

## 2012-07-16 ENCOUNTER — Ambulatory Visit (INDEPENDENT_AMBULATORY_CARE_PROVIDER_SITE_OTHER): Payer: Medicare HMO | Admitting: Family Medicine

## 2012-07-16 VITALS — BP 140/90 | Temp 98.7°F | Ht 66.0 in | Wt 178.0 lb

## 2012-07-16 DIAGNOSIS — Z720 Tobacco use: Secondary | ICD-10-CM

## 2012-07-16 DIAGNOSIS — I10 Essential (primary) hypertension: Secondary | ICD-10-CM

## 2012-07-16 DIAGNOSIS — M7540 Impingement syndrome of unspecified shoulder: Secondary | ICD-10-CM

## 2012-07-16 DIAGNOSIS — F17200 Nicotine dependence, unspecified, uncomplicated: Secondary | ICD-10-CM

## 2012-07-16 DIAGNOSIS — K219 Gastro-esophageal reflux disease without esophagitis: Secondary | ICD-10-CM

## 2012-07-16 LAB — LIPID PANEL: Triglycerides: 191 mg/dL — ABNORMAL HIGH (ref 0.0–149.0)

## 2012-07-16 LAB — BASIC METABOLIC PANEL
BUN: 17 mg/dL (ref 6–23)
CO2: 30 mEq/L (ref 19–32)
Calcium: 9.7 mg/dL (ref 8.4–10.5)
Creatinine, Ser: 0.8 mg/dL (ref 0.4–1.2)

## 2012-07-16 LAB — LDL CHOLESTEROL, DIRECT: Direct LDL: 138.4 mg/dL

## 2012-07-16 NOTE — Patient Instructions (Addendum)
-We placed a referral for you as discussed. It usually takes about 1-2 weeks to process and schedule this referral. If you have not heard from [place] regarding this appointment in 2 weeks please contact our office.  -We have ordered labs for your at this visit. It usually takes 1-2 weeks for these to result and be processed. We will contact you with instructions if your results are abnormal. Normal results will be release to your [facility] in 1-2 weeks. If you have not heard from [place] or can not find your results in [facility] in 2 weeks please contact our office.   -follow up in 1-2 months for yearly physical exam  Impingement Syndrome, Rotator Cuff, Bursitis with Rehab Impingement syndrome is a condition that involves inflammation of the tendons of the rotator cuff and the subacromial bursa, that causes pain in the shoulder. The rotator cuff consists of four tendons and muscles that control much of the shoulder and upper arm function. The subacromial bursa is a fluid filled sac that helps reduce friction between the rotator cuff and one of the bones of the shoulder (acromion). Impingement syndrome is usually an overuse injury that causes swelling of the bursa (bursitis), swelling of the tendon (tendonitis), and/or a tear of the tendon (strain). Strains are classified into three categories. Grade 1 strains cause pain, but the tendon is not lengthened. Grade 2 strains include a lengthened ligament, due to the ligament being stretched or partially ruptured. With grade 2 strains there is still function, although the function may be decreased. Grade 3 strains include a complete tear of the tendon or muscle, and function is usually impaired. SYMPTOMS   Pain around the shoulder, often at the outer portion of the upper arm.   Pain that gets worse with shoulder function, especially when reaching overhead or lifting.   Sometimes, aching when not using the arm.   Pain that wakes you up at night.   Sometimes,  tenderness, swelling, warmth, or redness over the affected area.   Loss of strength.   Limited motion of the shoulder, especially reaching behind the back (to the back pocket or to unhook bra) or across your body.   Crackling sound (crepitation) when moving the arm.   Biceps tendon pain and inflammation (in the front of the shoulder). Worse when bending the elbow or lifting.  CAUSES  Impingement syndrome is often an overuse injury, in which chronic (repetitive) motions cause the tendons or bursa to become inflamed. A strain occurs when a force is paced on the tendon or muscle that is greater than it can withstand. Common mechanisms of injury include: Stress from sudden increase in duration, frequency, or intensity of training.  Direct hit (trauma) to the shoulder.   Aging, erosion of the tendon with normal use.   Bony bump on shoulder (acromial spur).  RISK INCREASES WITH:  Contact sports (football, wrestling, boxing).   Throwing sports (baseball, tennis, volleyball).   Weightlifting and bodybuilding.   Heavy labor.   Previous injury to the rotator cuff, including impingement.   Poor shoulder strength and flexibility.   Failure to warm up properly before activity.   Inadequate protective equipment.   Old age.   Bony bump on shoulder (acromial spur).  PREVENTION   Warm up and stretch properly before activity.   Allow for adequate recovery between workouts.   Maintain physical fitness:   Strength, flexibility, and endurance.   Cardiovascular fitness.   Learn and use proper exercise technique.  PROGNOSIS  If  treated properly, impingement syndrome usually goes away within 6 weeks. Sometimes surgery is required.  RELATED COMPLICATIONS   Longer healing time if not properly treated, or if not given enough time to heal.   Recurring symptoms, that result in a chronic condition.   Shoulder stiffness, frozen shoulder, or loss of motion.   Rotator cuff tendon tear.     Recurring symptoms, especially if activity is resumed too soon, with overuse, with a direct blow, or when using poor technique.  TREATMENT  Treatment first involves the use of ice and medicine, to reduce pain and inflammation. The use of strengthening and stretching exercises may help reduce pain with activity. These exercises may be performed at home or with a therapist. If non-surgical treatment is unsuccessful after more than 6 months, surgery may be advised. After surgery and rehabilitation, activity is usually possible in 3 months.  MEDICATION  If pain medicine is needed, nonsteroidal anti-inflammatory medicines (aspirin and ibuprofen), or other minor pain relievers (acetaminophen), are often advised.   Do not take pain medicine for 7 days before surgery.   Prescription pain relievers may be given, if your caregiver thinks they are needed. Use only as directed and only as much as you need.   Corticosteroid injections may be given by your caregiver. These injections should be reserved for the most serious cases, because they may only be given a certain number of times.  HEAT AND COLD  Cold treatment (icing) should be applied for 10 to 15 minutes every 2 to 3 hours for inflammation and pain, and immediately after activity that aggravates your symptoms. Use ice packs or an ice massage.   Heat treatment may be used before performing stretching and strengthening activities prescribed by your caregiver, physical therapist, or athletic trainer. Use a heat pack or a warm water soak.  SEEK MEDICAL CARE IF:   Symptoms get worse or do not improve in 4 to 6 weeks, despite treatment.   New, unexplained symptoms develop. (Drugs used in treatment may produce side effects.)    and return to the starting position.   Document Released: 10/13/2005 Document Revised: 10/02/2011 Document Reviewed: 01/25/2009 [facility] Patient Information 2012 [place], [place].       Diet for GERD or PUD Nutrition  therapy can help ease the discomfort of gastroesophageal reflux disease (GERD) and peptic ulcer disease (PUD).  HOME CARE INSTRUCTIONS   Eat your meals slowly, in a relaxed setting.   Eat 5 to 6 small meals per day.   If a food causes distress, stop eating it for a period of time.  FOODS TO AVOID  Coffee, regular or decaffeinated.   Cola beverages, regular or low calorie.   Tea, regular or decaffeinated.   Pepper.   Cocoa.   High fat foods, including meats.   Butter, margarine, hydrogenated oil (trans fats).   Peppermint or spearmint (if you have GERD).   Fruits and vegetables if not tolerated.   Alcohol.   Nicotine (smoking or chewing). This is one of the most potent stimulants to acid production in the gastrointestinal tract.   Any food that seems to aggravate your condition.  If you have questions regarding your diet, ask your caregiver or a registered dietitian. TIPS  Lying flat may make symptoms worse. Keep the head of your bed raised 6 to 9 inches (15 to 23 cm) by using a foam wedge or blocks under the legs of the bed.   Do not lay down until 3 hours after eating a  meal.   Daily physical activity may help reduce symptoms.  MAKE SURE YOU:   Understand these instructions.   Will watch your condition.   Will get help right away if you are not doing well or get worse.  Document Released: 10/13/2005 Document Revised: 10/02/2011 Document Reviewed: 08/29/2011 [facility] Patient Information 2012 [place], [place].

## 2012-07-16 NOTE — Progress Notes (Addendum)
Chief Complaint  Patient presents with  . Establish Care    HPI:  Here to establish care and having some R arm and shoulder pain.  R shoulder/arm pain: - started a few months - can not think of any inciting injury  - pain is usually in shoulder, but occ has pain in deltoid area and occ has tingling in 1st 3 digits R arm - denies: fevers, chills, weight loss, weakness - aleve helps -hurts worse with raising arm above head  GERD: -diagnosed in the past with chest pain -was on protonix for some time and then stopped taking this a few years ago -symptoms: heartburn, acid coming up in throat - once every 2 weeks -denies:nausea, vomiting, abdominal pain, dysphagia, cough, breathing problems  Tobacco use: -chantix is on mail order -husband and her are going to stop smoking 1 week after getting this  Hypertension managed by her cardiologist whom she saw yesterday.     Has not had pneumovax or flu vaccine.  ROS: See pertinent positives and negatives per HPI.  Past Medical History  Diagnosis Date  . Cholecystitis   . HYPERTENSION   . Allergic rhinitis due to other allergen   . GERD   . LOW BACK PAIN   . ELECTROCARDIOGRAM, ABNORMAL   . Murmur     Family History  Problem Relation Age of Onset  . Diabetes    . Hypertension      History   Social History  . Marital Status: Married    Spouse Name: N/A    Number of Children: N/A  . Years of Education: N/A   Social History Main Topics  . Smoking status: Current Every Day Smoker -- 1.0 packs/day    Types: Cigarettes  . Smokeless tobacco: None  . Alcohol Use: No  . Drug Use: No  . Sexually Active: None   Other Topics Concern  . None   Social History Narrative  . None    Current outpatient prescriptions:atenolol (TENORMIN) 50 MG tablet, Take 1 tablet (50 mg total) by mouth daily., Disp: 30 tablet, Rfl: 11;  Loratadine-Pseudoephedrine (CLARITIN-D 24 HOUR PO), Take 1 tablet by mouth daily., Disp: , Rfl: ;  losartan  (COZAAR) 50 MG tablet, Take 1 tablet (50 mg total) by mouth daily., Disp: 30 tablet, Rfl: 11;  Naproxen Sodium (ALEVE) 220 MG CAPS, Take 2 capsules by mouth daily., Disp: , Rfl:  varenicline (CHANTIX CONTINUING MONTH PAK) 1 MG tablet, Take 1 tablet (1 mg total) by mouth 2 (two) times daily., Disp: 180 tablet, Rfl: 1  EXAM:  Filed Vitals:   07/16/12 1247  BP: 140/90  Temp: 98.7 F (37.1 C)    Body mass index is 28.73 kg/(m^2).  GENERAL: vitals reviewed and listed below, alert, oriented, appears well hydrated and in no acute distress  HEENT: atraumatic, conjucntiva clear, no obvious abnormalities on inspection of external nose and ears  NECK: no masses on inspection  LUNGS: clear to auscultation bilaterally, no wheezes, rales or rhonchi, good air movement  CV: HRRR, SEM, no peripheral edema  MS: moves all extremities without noticeable abnormality  Normal rom shoulder bilat TTP over lateral shoulder near attachment of suprspinatus to humerus Normal muscle strength throughout in upper ext bilat, though shoulder abduction and ext rot against rotation does cause pain. +impingement test on R shoulder -empty can, neg speeds, neg tinels, neg scarf test of R shoulder -NV intact R arm and hand   PSYCH: pleasant and cooperative, no obvious depression or anxiety  ASSESSMENT AND PLAN:  Discussed the following assessment and plan:  1. Shoulder impingement syndrome  Ambulatory referral to Physical Therapy, See pt instructions below - f/u in 1-2 months  2. GERD (gastroesophageal reflux disease)  Basic metabolic panel, Lipid Panel - for basic labs and overweight, TUMs given infrequent symptoms and lifestyle changes printed for patient  3. Tobacco use  Supported and < 5 minutes smoking cessation counselling f/u in 1-2 months    Orders Placed This Encounter  Procedures  . Basic metabolic panel  . Lipid Panel  . Ambulatory referral to Physical Therapy    Referral Priority:  Routine     Referral Type:  Physical Medicine    Referral Reason:  Specialty Services Required    Requested Specialty:  Physical Therapy    Number of Visits Requested:  1    Patient Instructions   -We placed a referral for you as discussed. It usually takes about 1-2 weeks to process and schedule this referral. If you have not heard from [place] regarding this appointment in 2 weeks please contact our office.  -We have ordered labs for your at this visit. It usually takes 1-2 weeks for these to result and be processed. We will contact you with instructions if your results are abnormal. Normal results will be release to your [facility] in 1-2 weeks. If you have not heard from [place] or can not find your results in [facility] in 2 weeks please contact our office.   -follow up in 1-2 months for yearly physical exam  Impingement Syndrome, Rotator Cuff, Bursitis with Rehab Impingement syndrome is a condition that involves inflammation of the tendons of the rotator cuff and the subacromial bursa, that causes pain in the shoulder. The rotator cuff consists of four tendons and muscles that control much of the shoulder and upper arm function. The subacromial bursa is a fluid filled sac that helps reduce friction between the rotator cuff and one of the bones of the shoulder (acromion). Impingement syndrome is usually an overuse injury that causes swelling of the bursa (bursitis), swelling of the tendon (tendonitis), and/or a tear of the tendon (strain). Strains are classified into three categories. Grade 1 strains cause pain, but the tendon is not lengthened. Grade 2 strains include a lengthened ligament, due to the ligament being stretched or partially ruptured. With grade 2 strains there is still function, although the function may be decreased. Grade 3 strains include a complete tear of the tendon or muscle, and function is usually impaired. SYMPTOMS   Pain around the shoulder, often at the outer portion of the upper arm.    Pain that gets worse with shoulder function, especially when reaching overhead or lifting.   Sometimes, aching when not using the arm.   Pain that wakes you up at night.   Sometimes, tenderness, swelling, warmth, or redness over the affected area.   Loss of strength.   Limited motion of the shoulder, especially reaching behind the back (to the back pocket or to unhook bra) or across your body.   Crackling sound (crepitation) when moving the arm.   Biceps tendon pain and inflammation (in the front of the shoulder). Worse when bending the elbow or lifting.  CAUSES  Impingement syndrome is often an overuse injury, in which chronic (repetitive) motions cause the tendons or bursa to become inflamed. A strain occurs when a force is paced on the tendon or muscle that is greater than it can withstand. Common mechanisms of injury include: Stress from  sudden increase in duration, frequency, or intensity of training.  Direct hit (trauma) to the shoulder.   Aging, erosion of the tendon with normal use.   Bony bump on shoulder (acromial spur).  RISK INCREASES WITH:  Contact sports (football, wrestling, boxing).   Throwing sports (baseball, tennis, volleyball).   Weightlifting and bodybuilding.   Heavy labor.   Previous injury to the rotator cuff, including impingement.   Poor shoulder strength and flexibility.   Failure to warm up properly before activity.   Inadequate protective equipment.   Old age.   Bony bump on shoulder (acromial spur).  PREVENTION   Warm up and stretch properly before activity.   Allow for adequate recovery between workouts.   Maintain physical fitness:   Strength, flexibility, and endurance.   Cardiovascular fitness.   Learn and use proper exercise technique.  PROGNOSIS  If treated properly, impingement syndrome usually goes away within 6 weeks. Sometimes surgery is required.  RELATED COMPLICATIONS   Longer healing time if not properly  treated, or if not given enough time to heal.   Recurring symptoms, that result in a chronic condition.   Shoulder stiffness, frozen shoulder, or loss of motion.   Rotator cuff tendon tear.   Recurring symptoms, especially if activity is resumed too soon, with overuse, with a direct blow, or when using poor technique.  TREATMENT  Treatment first involves the use of ice and medicine, to reduce pain and inflammation. The use of strengthening and stretching exercises may help reduce pain with activity. These exercises may be performed at home or with a therapist. If non-surgical treatment is unsuccessful after more than 6 months, surgery may be advised. After surgery and rehabilitation, activity is usually possible in 3 months.  MEDICATION  If pain medicine is needed, nonsteroidal anti-inflammatory medicines (aspirin and ibuprofen), or other minor pain relievers (acetaminophen), are often advised.   Do not take pain medicine for 7 days before surgery.   Prescription pain relievers may be given, if your caregiver thinks they are needed. Use only as directed and only as much as you need.   Corticosteroid injections may be given by your caregiver. These injections should be reserved for the most serious cases, because they may only be given a certain number of times.  HEAT AND COLD  Cold treatment (icing) should be applied for 10 to 15 minutes every 2 to 3 hours for inflammation and pain, and immediately after activity that aggravates your symptoms. Use ice packs or an ice massage.   Heat treatment may be used before performing stretching and strengthening activities prescribed by your caregiver, physical therapist, or athletic trainer. Use a heat pack or a warm water soak.  SEEK MEDICAL CARE IF:   Symptoms get worse or do not improve in 4 to 6 weeks, despite treatment.   New, unexplained symptoms develop. (Drugs used in treatment may produce side effects.)  EXERCISES  RANGE OF MOTION (ROM)  AND STRETCHING EXERCISES - Impingement Syndrome (Rotator Cuff  Tendinitis, Bursitis) These exercises may help you when beginning to rehabilitate your injury. Your symptoms may go away with or without further involvement from your physician, physical therapist or athletic trainer. While completing these exercises, remember:   Restoring tissue flexibility helps normal motion to return to the joints. This allows healthier, less painful movement and activity.   An effective stretch should be held for at least 30 seconds.   A stretch should never be painful. You should only feel a gentle lengthening or release  in the stretched tissue.  STRETCH - Flexion, Standing  Stand with good posture. With an underhand grip on your right / left hand, and an overhand grip on the opposite hand, grasp a broomstick or cane so that your hands are a little more than shoulder width apart.   Keeping your right / left elbow straight and shoulder muscles relaxed, push the stick with your opposite hand, to raise your right / left arm in front of your body and then overhead. Raise your arm until you feel a stretch in your right / left shoulder, but before you have increased shoulder pain.   Try to avoid shrugging your right / left shoulder as your arm rises, by keeping your shoulder blade tucked down and toward your mid-back spine. Hold for __________ seconds.   Slowly return to the starting position.  Repeat __________ times. Complete this exercise __________ times per day. STRETCH - Abduction, Supine  Lie on your back. With an underhand grip on your right / left hand and an overhand grip on the opposite hand, grasp a broomstick or cane so that your hands are a little more than shoulder width apart.   Keeping your right / left elbow straight and your shoulder muscles relaxed, push the stick with your opposite hand, to raise your right / left arm out to the side of your body and then overhead. Raise your arm until you feel  a stretch in your right / left shoulder, but before you have increased shoulder pain.   Try to avoid shrugging your right / left shoulder as your arm rises, by keeping your shoulder blade tucked down and toward your mid-back spine. Hold for __________ seconds.   Slowly return to the starting position.  Repeat __________ times. Complete this exercise __________ times per day. ROM - Flexion, Active-Assisted  Lie on your back. You may bend your knees for comfort.   Grasp a broomstick or cane so your hands are about shoulder width apart. Your right / left hand should grip the end of the stick, so that your hand is positioned "thumbs-up," as if you were about to shake hands.   Using your healthy arm to lead, raise your right / left arm overhead, until you feel a gentle stretch in your shoulder. Hold for __________ seconds.   Use the stick to assist in returning your right / left arm to its starting position.  Repeat __________ times. Complete this exercise __________ times per day.  ROM - Internal Rotation, Supine   Lie on your back on a firm surface. Place your right / left elbow about 60 degrees away from your side. Elevate your elbow with a folded towel, so that the elbow and shoulder are the same height.   Using a broomstick or cane and your strong arm, pull your right / left hand toward your body until you feel a gentle stretch, but no increase in your shoulder pain. Keep your shoulder and elbow in place throughout the exercise.   Hold for __________ seconds. Slowly return to the starting position.  Repeat __________ times. Complete this exercise __________ times per day. STRETCH - Internal Rotation  Place your right / left hand behind your back, palm up.   Throw a towel or belt over your opposite shoulder. Grasp the towel with your right / left hand.   While keeping an upright posture, gently pull up on the towel, until you feel a stretch in the front of your right / left shoulder.  Avoid shrugging your right / left shoulder as your arm rises, by keeping your shoulder blade tucked down and toward your mid-back spine.   Hold for __________ seconds. Release the stretch, by lowering your healthy hand.  Repeat __________ times. Complete this exercise __________ times per day. ROM - Internal Rotation   Using an underhand grip, grasp a stick behind your back with both hands.   While standing upright with good posture, slide the stick up your back until you feel a mild stretch in the front of your shoulder.   Hold for __________ seconds. Slowly return to your starting position.  Repeat __________ times. Complete this exercise __________ times per day.  STRETCH - Posterior Shoulder Capsule   Stand or sit with good posture. Grasp your right / left elbow and draw it across your chest, keeping it at the same height as your shoulder.   Pull your elbow, so your upper arm comes in closer to your chest. Pull until you feel a gentle stretch in the back of your shoulder.   Hold for __________ seconds.  Repeat __________ times. Complete this exercise __________ times per day. STRENGTHENING EXERCISES - Impingement Syndrome (Rotator Cuff Tendinitis, Bursitis) These exercises may help you when beginning to rehabilitate your injury. They may resolve your symptoms with or without further involvement from your physician, physical therapist or athletic trainer. While completing these exercises, remember:  Muscles can gain both the endurance and the strength needed for everyday activities through controlled exercises.   Complete these exercises as instructed by your physician, physical therapist or athletic trainer. Increase the resistance and repetitions only as guided.   You may experience muscle soreness or fatigue, but the pain or discomfort you are trying to eliminate should never worsen during these exercises. If this pain does get worse, stop and make sure you are following the  directions exactly. If the pain is still present after adjustments, discontinue the exercise until you can discuss the trouble with your clinician.   During your recovery, avoid activity or exercises which involve actions that place your injured hand or elbow above your head or behind your back or head. These positions stress the tissues which you are trying to heal.  STRENGTH - Scapular Depression and Adduction   With good posture, sit on a firm chair. Support your arms in front of you, with pillows, arm rests, or on a table top. Have your elbows in line with the sides of your body.   Gently draw your shoulder blades down and toward your mid-back spine. Gradually increase the tension, without tensing the muscles along the top of your shoulders and the back of your neck.   Hold for __________ seconds. Slowly release the tension and relax your muscles completely before starting the next repetition.   After you have practiced this exercise, remove the arm support and complete the exercise in standing as well as sitting position.  Repeat __________ times. Complete this exercise __________ times per day.  STRENGTH - Shoulder Abductors, Isometric  With good posture, stand or sit about 4-6 inches from a wall, with your right / left side facing the wall.   Bend your right / left elbow. Gently press your right / left elbow into the wall. Increase the pressure gradually, until you are pressing as hard as you can, without shrugging your shoulder or increasing any shoulder discomfort.   Hold for __________ seconds.   Release the tension slowly. Relax your shoulder muscles completely before you begin the  next repetition.  Repeat __________ times. Complete this exercise __________ times per day.  STRENGTH - External Rotators, Isometric  Keep your right / left elbow at your side and bend it 90 degrees.   Step into a door frame so that the outside of your right / left wrist can press against the door  frame without your upper arm leaving your side.   Gently press your right / left wrist into the door frame, as if you were trying to swing the back of your hand away from your stomach. Gradually increase the tension, until you are pressing as hard as you can, without shrugging your shoulder or increasing any shoulder discomfort.   Hold for __________ seconds.   Release the tension slowly. Relax your shoulder muscles completely before you begin the next repetition.  Repeat __________ times. Complete this exercise __________ times per day.  STRENGTH - Supraspinatus   Stand or sit with good posture. Grasp a __________ weight, or an exercise band or tubing, so that your hand is "thumbs-up," like you are shaking hands.   Slowly lift your right / left arm in a "V" away from your thigh, diagonally into the space between your side and straight ahead. Lift your hand to shoulder height or as far as you can, without increasing any shoulder pain. At first, many people do not lift their hands above shoulder height.   Avoid shrugging your right / left shoulder as your arm rises, by keeping your shoulder blade tucked down and toward your mid-back spine.   Hold for __________ seconds. Control the descent of your hand, as you slowly return to your starting position.  Repeat __________ times. Complete this exercise __________ times per day.  STRENGTH - External Rotators  Secure a rubber exercise band or tubing to a fixed object (table, pole) so that it is at the same height as your right / left elbow when you are standing or sitting on a firm surface.   Stand or sit so that the secured exercise band is at your uninjured side.   Bend your right / left elbow 90 degrees. Place a folded towel or small pillow under your right / left arm, so that your elbow is a few inches away from your side.   Keeping the tension on the exercise band, pull it away from your body, as if pivoting on your elbow. Be sure to keep  your body steady, so that the movement is coming only from your rotating shoulder.   Hold for __________ seconds. Release the tension in a controlled manner, as you return to the starting position.  Repeat __________ times. Complete this exercise __________ times per day.  STRENGTH - Internal Rotators   Secure a rubber exercise band or tubing to a fixed object (table, pole) so that it is at the same height as your right / left elbow when you are standing or sitting on a firm surface.   Stand or sit so that the secured exercise band is at your right / left side.   Bend your elbow 90 degrees. Place a folded towel or small pillow under your right / left arm so that your elbow is a few inches away from your side.   Keeping the tension on the exercise band, pull it across your body, toward your stomach. Be sure to keep your body steady, so that the movement is coming only from your rotating shoulder.   Hold for __________ seconds. Release the tension in a controlled  manner, as you return to the starting position.  Repeat __________ times. Complete this exercise __________ times per day.  STRENGTH - Scapular Protractors, Standing   Stand arms length away from a wall. Place your hands on the wall, keeping your elbows straight.   Begin by dropping your shoulder blades down and toward your mid-back spine.   To strengthen your protractors, keep your shoulder blades down, but slide them forward on your rib cage. It will feel as if you are lifting the back of your rib cage away from the wall. This is a subtle motion and can be challenging to complete. Ask your caregiver for further instruction, if you are not sure you are doing the exercise correctly.   Hold for __________ seconds. Slowly return to the starting position, resting the muscles completely before starting the next repetition.  Repeat __________ times. Complete this exercise __________ times per day. STRENGTH - Scapular Protractors,  Supine  Lie on your back on a firm surface. Extend your right / left arm straight into the air while holding a __________ weight in your hand.   Keeping your head and back in place, lift your shoulder off the floor.   Hold for __________ seconds. Slowly return to the starting position, and allow your muscles to relax completely before starting the next repetition.  Repeat __________ times. Complete this exercise __________ times per day. STRENGTH - Scapular Protractors, Quadruped  Get onto your hands and knees, with your shoulders directly over your hands (or as close as you can be, comfortably).   Keeping your elbows locked, lift the back of your rib cage up into your shoulder blades, so your mid-back rounds out. Keep your neck muscles relaxed.   Hold this position for __________ seconds. Slowly return to the starting position and allow your muscles to relax completely before starting the next repetition.  Repeat __________ times. Complete this exercise __________ times per day.  STRENGTH - Scapular Retractors  Secure a rubber exercise band or tubing to a fixed object (table, pole), so that it is at the height of your shoulders when you are either standing, or sitting on a firm armless chair.   With a palm down grip, grasp an end of the band in each hand. Straighten your elbows and lift your hands straight in front of you, at shoulder height. Step back, away from the secured end of the band, until it becomes tense.   Squeezing your shoulder blades together, draw your elbows back toward your sides, as you bend them. Keep your upper arms lifted away from your body throughout the exercise.   Hold for __________ seconds. Slowly ease the tension on the band, as you reverse the directions and return to the starting position.  Repeat __________ times. Complete this exercise __________ times per day. STRENGTH - Shoulder Extensors   Secure a rubber exercise band or tubing to a fixed object  (table, pole) so that it is at the height of your shoulders when you are either standing, or sitting on a firm armless chair.   With a thumbs-up grip, grasp an end of the band in each hand. Straighten your elbows and lift your hands straight in front of you, at shoulder height. Step back, away from the secured end of the band, until it becomes tense.   Squeezing your shoulder blades together, pull your hands down to the sides of your thighs. Do not allow your hands to go behind you.   Hold for __________ seconds. Slowly  ease the tension on the band, as you reverse the directions and return to the starting position.  Repeat __________ times. Complete this exercise __________ times per day.  STRENGTH - Scapular Retractors and External Rotators   Secure a rubber exercise band or tubing to a fixed object (table, pole) so that it is at the height as your shoulders, when you are either standing, or sitting on a firm armless chair.   With a palm down grip, grasp an end of the band in each hand. Bend your elbows 90 degrees and lift your elbows to shoulder height, at your sides. Step back, away from the secured end of the band, until it becomes tense.   Squeezing your shoulder blades together, rotate your shoulders so that your upper arms and elbows remain stationary, but your fists travel upward to head height.   Hold for __________ seconds. Slowly ease the tension on the band, as you reverse the directions and return to the starting position.  Repeat __________ times. Complete this exercise __________ times per day.  STRENGTH - Scapular Retractors and External Rotators, Rowing   Secure a rubber exercise band or tubing to a fixed object (table, pole) so that it is at the height of your shoulders, when you are either standing, or sitting on a firm armless chair.   With a palm down grip, grasp an end of the band in each hand. Straighten your elbows and lift your hands straight in front of you, at  shoulder height. Step back, away from the secured end of the band, until it becomes tense.   Step 1: Squeeze your shoulder blades together. Bending your elbows, draw your hands to your chest, as if you are rowing a boat. At the end of this motion, your hands and elbow should be at shoulder height and your elbows should be out to your sides.   Step 2: Rotate your shoulders, to raise your hands above your head. Your forearms should be vertical and your upper arms should be horizontal.   Hold for __________ seconds. Slowly ease the tension on the band, as you reverse the directions and return to the starting position.  Repeat __________ times. Complete this exercise __________ times per day.  STRENGTH - Scapular Depressors  Find a sturdy chair without wheels, such as a dining room chair.   Keeping your feet on the floor, and your hands on the chair arms, lift your bottom up from the seat, and lock your elbows.   Keeping your elbows straight, allow gravity to pull your body weight down. Your shoulders will rise toward your ears.   Raise your body against gravity by drawing your shoulder blades down your back, shortening the distance between your shoulders and ears. Although your feet should always maintain contact with the floor, your feet should progressively support less body weight, as you get stronger.   Hold for __________ seconds. In a controlled and slow manner, lower your body weight to begin the next repetition.  Repeat __________ times. Complete this exercise __________ times per day.  Document Released: 10/13/2005 Document Revised: 10/02/2011 Document Reviewed: 01/25/2009 [facility] Patient Information 2012 [place], [place].       Diet for GERD or PUD Nutrition therapy can help ease the discomfort of gastroesophageal reflux disease (GERD) and peptic ulcer disease (PUD).  HOME CARE INSTRUCTIONS   Eat your meals slowly, in a relaxed setting.   Eat 5 to 6 small meals per day.    If a food causes distress,  stop eating it for a period of time.  FOODS TO AVOID  Coffee, regular or decaffeinated.   Cola beverages, regular or low calorie.   Tea, regular or decaffeinated.   Pepper.   Cocoa.   High fat foods, including meats.   Butter, margarine, hydrogenated oil (trans fats).   Peppermint or spearmint (if you have GERD).   Fruits and vegetables if not tolerated.   Alcohol.   Nicotine (smoking or chewing). This is one of the most potent stimulants to acid production in the gastrointestinal tract.   Any food that seems to aggravate your condition.  If you have questions regarding your diet, ask your caregiver or a registered dietitian. TIPS  Lying flat may make symptoms worse. Keep the head of your bed raised 6 to 9 inches (15 to 23 cm) by using a foam wedge or blocks under the legs of the bed.   Do not lay down until 3 hours after eating a meal.   Daily physical activity may help reduce symptoms.  MAKE SURE YOU:   Understand these instructions.   Will watch your condition.   Will get help right away if you are not doing well or get worse.  Document Released: 10/13/2005 Document Revised: 10/02/2011 Document Reviewed: 08/29/2011 [facility] Patient Information 2012 [place], [place].    Return to clinic immediately if symptoms worsen or persist or new concerns.  Return in about 1 month (around 08/15/2012) for physical exam.  Kriste Basque R.

## 2012-07-19 ENCOUNTER — Telehealth: Payer: Self-pay | Admitting: Cardiovascular Disease

## 2012-07-19 MED ORDER — VARENICLINE TARTRATE 0.5 MG X 11 & 1 MG X 42 PO MISC: ORAL | Status: DC

## 2012-07-19 NOTE — Telephone Encounter (Signed)
New Problem:    Patient called in needing a prescription called in for the Started pak of varenicline (CHANTIX CONTINUING MONTH PAK) 1 MG tablet not the continuing pak.

## 2012-07-19 NOTE — Progress Notes (Signed)
Quick Note:  Called and spoke with pt and pt is aware. ______ 

## 2012-07-19 NOTE — Telephone Encounter (Signed)
Called into pharmacy

## 2012-07-21 ENCOUNTER — Ambulatory Visit (HOSPITAL_COMMUNITY): Payer: Medicare HMO | Attending: Cardiology | Admitting: Radiology

## 2012-07-21 DIAGNOSIS — I369 Nonrheumatic tricuspid valve disorder, unspecified: Secondary | ICD-10-CM | POA: Insufficient documentation

## 2012-07-21 DIAGNOSIS — R011 Cardiac murmur, unspecified: Secondary | ICD-10-CM

## 2012-07-21 DIAGNOSIS — F17200 Nicotine dependence, unspecified, uncomplicated: Secondary | ICD-10-CM | POA: Insufficient documentation

## 2012-07-21 DIAGNOSIS — I10 Essential (primary) hypertension: Secondary | ICD-10-CM | POA: Insufficient documentation

## 2012-07-21 NOTE — Progress Notes (Signed)
Echocardiogram performed.  

## 2012-07-22 ENCOUNTER — Ambulatory Visit: Payer: Medicare HMO | Attending: Family Medicine

## 2012-07-22 ENCOUNTER — Telehealth: Payer: Self-pay | Admitting: Cardiovascular Disease

## 2012-07-22 DIAGNOSIS — M25619 Stiffness of unspecified shoulder, not elsewhere classified: Secondary | ICD-10-CM | POA: Insufficient documentation

## 2012-07-22 DIAGNOSIS — IMO0001 Reserved for inherently not codable concepts without codable children: Secondary | ICD-10-CM | POA: Insufficient documentation

## 2012-07-22 DIAGNOSIS — M25519 Pain in unspecified shoulder: Secondary | ICD-10-CM | POA: Insufficient documentation

## 2012-07-22 DIAGNOSIS — R5381 Other malaise: Secondary | ICD-10-CM | POA: Insufficient documentation

## 2012-07-22 NOTE — Telephone Encounter (Signed)
Pt rtn your call

## 2012-07-22 NOTE — Telephone Encounter (Signed)
PT AWARE OF ECHO RESULTS./CY 

## 2012-07-27 ENCOUNTER — Ambulatory Visit: Payer: Medicare HMO | Attending: Family Medicine

## 2012-07-27 DIAGNOSIS — M25519 Pain in unspecified shoulder: Secondary | ICD-10-CM | POA: Insufficient documentation

## 2012-07-27 DIAGNOSIS — M25619 Stiffness of unspecified shoulder, not elsewhere classified: Secondary | ICD-10-CM | POA: Insufficient documentation

## 2012-07-27 DIAGNOSIS — IMO0001 Reserved for inherently not codable concepts without codable children: Secondary | ICD-10-CM | POA: Insufficient documentation

## 2012-07-27 DIAGNOSIS — R5381 Other malaise: Secondary | ICD-10-CM | POA: Insufficient documentation

## 2012-07-29 ENCOUNTER — Ambulatory Visit: Payer: Medicare HMO | Admitting: Physical Therapy

## 2012-08-03 ENCOUNTER — Ambulatory Visit: Payer: Medicare HMO

## 2012-08-05 ENCOUNTER — Ambulatory Visit: Payer: Medicare HMO

## 2012-08-16 ENCOUNTER — Ambulatory Visit (INDEPENDENT_AMBULATORY_CARE_PROVIDER_SITE_OTHER): Payer: Medicare HMO | Admitting: Family Medicine

## 2012-08-16 ENCOUNTER — Encounter: Payer: Self-pay | Admitting: Family Medicine

## 2012-08-16 VITALS — BP 120/84 | HR 75 | Temp 98.2°F | Wt 181.0 lb

## 2012-08-16 DIAGNOSIS — Z0000 Encounter for general adult medical examination without abnormal findings: Secondary | ICD-10-CM

## 2012-08-16 NOTE — Patient Instructions (Signed)
We recommend the following healthy lifestyle measures: - eat a healthy diet consisting of lots of vegetables, fruits, beans, nuts, seeds, healthy meats such as white chicken and fish and whole grains.  - avoid fried foods, fast food, processed foods, sodas, red meet and other fattening foods.  - get a least 150 minutes of aerobic exercise per week.   Follow up in 3 months and we will recheck your cholesterol  Please schedule your mammogram  Please start Aspirin daily  Please complete stool cards  Please schedule appointment with your gynecologist  We sent a referral for your bone density test

## 2012-08-16 NOTE — Progress Notes (Signed)
No chief complaint on file.   HPI:  Here for CPE:  -Concerns today: none  -Diet: variety of foods, balance and well rounded, larger portion sizes  -Taking folic acid: no  -Exercise: no regular exercise  -Diabetes and Dyslipidemia Screening: recent screening, discussed tx options  -NIH/fram ten year CV risk: 12%, benefit of asa outweighs risk  - discussed, used to take aspirin daily, but then stopped when ran out  -Hx of HTN: yes, treated  -Vaccines: UTD  -pap history: wants to see gyn, s/p hysterectomy  -FDLMP: post menopausal  -sexual activity: yes, female partner, no new partners  -wants STI testing: no  -FH breast, colon or ovarian ca: see FH -Colon cancer screening: never had,wants to do stool cards - refused other options -last mammo: 5 years ago  -Depression: No  -Hep C screening: refused  -Osteoporosis Screening: DEXA five years ago  -Alcohol, Tobacco, drug use: see social history - quit smoking sept 2013  Review of Systems - General ROS: negative for - chills, fever, malaise, weight gain or weight loss Psychological ROS: negative for - anxiety or depression Ophthalmic ROS: negative negative for - blurry vision, loss of vision or uses contacts ENT ROS: negative for - headaches or nasal congestion Allergy and Immunology ROS: negative for - itchy/watery eyes or nasal congestion Hematological and Lymphatic ROS: negative for - bleeding problems, night sweats or weight loss Respiratory ROS: no cough, shortness of breath, or wheezing Cardiovascular ROS: no chest pain or dyspnea on exertion Gastrointestinal ROS: no abdominal pain, change in bowel habits, or black or bloody stools Musculoskeletal ROS: negative for - muscle pain or muscular weakness Neurological ROS: no TIA or stroke symptoms Dermatological ROS: negative for skin lesion changes  Past Medical History  Diagnosis Date  . Cholecystitis   . HYPERTENSION   . Allergic rhinitis due to other allergen     . GERD   . LOW BACK PAIN   . ELECTROCARDIOGRAM, ABNORMAL   . Murmur     Family History  Problem Relation Age of Onset  . Diabetes    . Hypertension      History   Social History  . Marital Status: Married    Spouse Name: N/A    Number of Children: N/A  . Years of Education: N/A   Social History Main Topics  . Smoking status: Current Every Day Smoker -- 1.0 packs/day    Types: Cigarettes  . Smokeless tobacco: Not on file  . Alcohol Use: No  . Drug Use: No  . Sexually Active: Not on file   Other Topics Concern  . Not on file   Social History Narrative  . No narrative on file    Current outpatient prescriptions:atenolol (TENORMIN) 50 MG tablet, Take 1 tablet (50 mg total) by mouth daily., Disp: 30 tablet, Rfl: 11;  Loratadine-Pseudoephedrine (CLARITIN-D 24 HOUR PO), Take 1 tablet by mouth daily., Disp: , Rfl: ;  losartan (COZAAR) 50 MG tablet, Take 1 tablet (50 mg total) by mouth daily., Disp: 30 tablet, Rfl: 11;  Naproxen Sodium (ALEVE) 220 MG CAPS, Take 2 capsules by mouth daily., Disp: , Rfl:  varenicline (CHANTIX CONTINUING MONTH PAK) 1 MG tablet, Take 1 tablet (1 mg total) by mouth 2 (two) times daily., Disp: 180 tablet, Rfl: 1;  varenicline (CHANTIX STARTING MONTH PAK) 0.5 MG X 11 & 1 MG X 42 tablet, Take one 0.5 mg tablet by mouth once daily for 3 days, then increase to one 0.5 mg tablet twice daily  for 4 days, then increase to one 1 mg tablet twice daily., Disp: 53 tablet, Rfl: 0  EXAM:  There were no vitals filed for this visit.  GENERAL: vitals reviewed and listed below, alert, oriented, appears well hydrated and in no acute distress  HEENT: head atraumatic, PERRLA, normal appearance of eyes, ears, nose and mouth. moist mucus membranes.  NECK: supple, no masses or lymphadenopathy  LUNGS: clear to auscultation bilaterally, no rales, rhonchi or wheeze  CV: HRRR, no peripheral edema or cyanosis, normal pedal pulses  BREAST: normal appearance - no lesions or  discharge, on palpation normal breast tissue without any suspicious masses  ABDOMEN: bowel sounds normal, soft, non tender to palpation, no masses, no rebound or guarding  GU: Refused  RECTAL: refused  SKIN: no rash or abnormal lesions  MS: normal gait, moves all extremities normally  NEURO: CN II-XII grossly intact, normal muscle strength and sensation to light touch on extremities  PSYCH: normal affect, pleasant and cooperative  ASSESSMENT AND PLAN:  Discussed the following assessment and plan:  No diagnosis found.  -67- yo Female with health issues listed in PMH  -Discussed and advised all [place] preventive services health task force level A and B recommendations for age, sex and risks. preventive measures:  Smoking cessation - counselling last appt and on chantix - she quit smoking 3 weeks ago - congratulated and supported on this Discussed risk/benefits: ASA, pt to restart this Discussed risks/benefits statin: pt wold like to work on diet and exercise for now, will recheck lipids in 3-4 months Colon cancer screening: pt refused colonoscopy, wants to do stool cards Cervical cancer screening: pt wants to do this with Gyn doctor, but s/p hysterectomy Breast cancer screening: information provided for patient to schedule mammongram Osteoporosis screening: ordered Hep C: refused  -Advised at least 150 minutes of exercise per week and a healthy diet low in saturated fats and sweets and consisting of fresh fruits and vegetables, lean meats such as fish and white chicken and whole grains.  -labs, studies and vaccines per orders this encounter  No orders of the defined types were placed in this encounter.    There are no Patient Instructions on file for this visit.  Patient advised to return to clinic immediately if symptoms worsen or persist or new concerns.  @LIFEPLAN @  No Follow-up on file.  Kriste Basque R.

## 2012-08-16 NOTE — Progress Notes (Signed)
Welcome to [facility] Preventive Care Visit:  1) Review of medical history:   has a past medical history of Cholecystitis; HYPERTENSION; Allergic rhinitis due to other allergen; GERD; LOW BACK PAIN; ELECTROCARDIOGRAM, ABNORMAL; and Murmur.   has past surgical history that includes Laparoscopic cholecystectomy and Abdominal hysterectomy (1983).  History   Social History  . Marital Status: Married    Spouse Name: N/A    Number of Children: N/A  . Years of Education: N/A   Occupational History  . Not on file.   Social History Main Topics  . Smoking status: Former Smoker -- 1.0 packs/day    Types: Cigarettes    Quit date: 08/01/2012  . Smokeless tobacco: Not on file  . Alcohol Use: No  . Drug Use: No  . Sexually Active: Not on file   Other Topics Concern  . Not on file   Social History Narrative  . No narrative on file    family history includes Diabetes in an unspecified family member and Hypertension in an unspecified family member.  Ms. Schiano does not currently have medications on file.   2) Depression Screening: Over the past two weeks, have you felt down, depressed or hopeless? NO Over the past two weeks, have you felt little interest or pleasure in doing things?  NO  3) Review of functional ability and level of safety:  Any difficulty hearing? Uses hearing aides - hearing good with these  History of falling?  NO  Any trouble with IADLs - using a phone, using transportation, grocery shopping, preparing meals, doing housework, doing laundry, taking medications and managing money? NO  Advance Directives? NO - discussed, pt will look into this  4)Focused Physical Exam: Filed Vitals:   08/16/12 0812  BP: 120/84  Pulse: 75  Temp: 98.2 F (36.8 C)   There is no height on file to calculate BMI. Visual Acuity: grossly intact General: Alert and Oriented, no acute distress  5)EKG (optional) -had EKG with card 05/2012  6)Education and counseling  regarding the above review of health provided with a plan for the following: -see scanned patient completed form for further details -fall prevention strategies discussed  -healthy lifestyle discussed -importance and resources for completing advanced directives discussed -see patient instructions below for any other recommendations provided  7)The following preventive measures were reviewed with assessment and plan made per below:       Pneumococcal (PPSV23 -one dose after 64, one before if risk factors), influenza yearly and hepatitis B vaccines (if high risk - end stage renal disease, IV drugs, homosexual men, live in home for mentally retarded, hemophilia receiving factors) ASSESSMENT/PLAN: -refused this for now      Screening mammograph (yearly if >40) ASSESSMENT/PLAN: -pt to schedule      Screening Pap smear/pelvic exam (q2 years) ASSESSMENT/PLAN: -she will see gyn      Prostate cancer screening ASSESSMENT/PLAN: N/A      Colorectal cancer screening (FOBT yearly or flex sig q4y or colonoscopy q10y or barium enema q4y) ASSESSMENT/PLAN: -FOBT per patient preference      Diabetes outpatient self-management training services ASSESSMENT/PLAN: N/A      Bone mass measurements(covered q2y if indicated - estrogen def, osteoporosis, hyperparathyroid, vertebral abnormalities, osteoporosis or steroids) ASSESSMENT/PLAN: ordered      Screening for glaucoma(q1y if high risk - diabetes, FH, AA and > 50 or hispanic and > 65) ASSESSMENT/PLAN: Not high risk      Medical nutritional therapy for individuals with diabetes or renal disease ASSESSMENT/PLAN: No diabetes  or renal disease      Cardiovascular screening blood tests (lipids q5y) ASSESSMENT/PLAN: Lipids screened recenlty      Diabetes screening tests ASSESSMENT/PLAN: Screened recently, results discussed  There are no Patient Instructions on file for this visit.

## 2012-08-17 ENCOUNTER — Other Ambulatory Visit: Payer: Self-pay | Admitting: Family Medicine

## 2012-08-17 DIAGNOSIS — Z1231 Encounter for screening mammogram for malignant neoplasm of breast: Secondary | ICD-10-CM

## 2012-08-17 DIAGNOSIS — Z780 Asymptomatic menopausal state: Secondary | ICD-10-CM

## 2012-08-24 ENCOUNTER — Ambulatory Visit: Payer: Medicare HMO

## 2012-08-25 ENCOUNTER — Other Ambulatory Visit: Payer: Medicare HMO

## 2012-09-06 ENCOUNTER — Encounter: Payer: Self-pay | Admitting: Cardiovascular Disease

## 2012-09-06 ENCOUNTER — Ambulatory Visit (INDEPENDENT_AMBULATORY_CARE_PROVIDER_SITE_OTHER): Payer: Medicare HMO | Admitting: Cardiovascular Disease

## 2012-09-06 VITALS — BP 162/93 | HR 82 | Wt 179.0 lb

## 2012-09-06 DIAGNOSIS — I10 Essential (primary) hypertension: Secondary | ICD-10-CM

## 2012-09-06 DIAGNOSIS — R011 Cardiac murmur, unspecified: Secondary | ICD-10-CM

## 2012-09-06 DIAGNOSIS — F17200 Nicotine dependence, unspecified, uncomplicated: Secondary | ICD-10-CM

## 2012-09-06 DIAGNOSIS — R9431 Abnormal electrocardiogram [ECG] [EKG]: Secondary | ICD-10-CM

## 2012-09-06 MED ORDER — LOSARTAN POTASSIUM 100 MG PO TABS: 100.0000 mg | ORAL_TABLET | Freq: Every day | ORAL | Status: DC

## 2012-09-06 NOTE — Assessment & Plan Note (Signed)
Suboptimally controlled increase cozaar to 100 mg and F/U in 3 months

## 2012-09-06 NOTE — Patient Instructions (Signed)
Your physician recommends that you schedule a follow-up appointment in:  3 MONTHS WITH DR [facility]  Your physician has recommended you make the following change in your medication:  INCREASE LOSARTAN TO 100 MG EVERY DAY

## 2012-09-06 NOTE — Assessment & Plan Note (Signed)
AV sclerosis.  No VSD on echo no need for SBE

## 2012-09-06 NOTE — Assessment & Plan Note (Signed)
Poor R wave progression likely from body habitus and lead position echo normal with no RWMAls

## 2012-09-06 NOTE — Assessment & Plan Note (Signed)
She and her husband have abstained for a month.  She is off Chantix due to lethargy.  Continued encouragement given

## 2012-09-06 NOTE — Progress Notes (Signed)
Patient ID: Melissa Nixon, female   DOB: 10-06-1946, 66 y.o.   MRN: [id number] 66 yo previously seen by Dr Tenny Craw. Referred by Dr Sherryle Lis eye doctor for HTN. She use to see Dr Yetta Barre for primary care but not recently. Still smoking. Has tried chantix in past. Was on diovan hctz with Dr Yetta Barre but stopped it Because of headache and has not taken anything for BPin 2 years. She had been on beta blocker with Dr Tenny Craw before and no problem with this. Denies chest pain palpitations or dsypnea. No cough. No history of kidney disease or RAS. 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 Denies excess ETOH.  Since I last saw her she had swelling with norvasc and is on a beta blocker. BP still high Not started Chantix.   Echo reviewed from 9/13  No VSD  Study Conclusions  - Left ventricle: The cavity size was normal. Wall thickness was increased in a pattern of moderate LVH. The estimated ejection fraction was 60%. Wall motion was normal; there were no regional wall motion abnormalities. Doppler parameters are consistent with high ventricular filling pressure. - Aortic valve: Sclerosis without stenosis. No significant regurgitation. - Pulmonary arteries: PA peak pressure: 31mm Hg (S).   BP still running High Quit smoking in October.  Getting lipids checked at primary TGL a bit high   ROS: Denies fever, malais, weight loss, blurry vision, decreased visual acuity, cough, sputum, SOB, hemoptysis, pleuritic pain, palpitaitons, heartburn, abdominal pain, melena, lower extremity edema, claudication, or rash.  All other systems reviewed and negative  General: Affect appropriate Healthy:  appears stated age HEENT: normal Neck supple with no adenopathy JVP normal no bruits no thyromegaly Lungs clear with no wheezing and good diaphragmatic motion Heart:  S1/S2 no murmur, no rub, gallop or click PMI normal Abdomen: benighn, BS positve, no tenderness, no AAA no bruit.   No HSM or HJR Distal pulses intact with no bruits No edema Neuro non-focal Skin warm and dry No muscular weakness   Current Outpatient Prescriptions  Medication Sig Dispense Refill  . atenolol (TENORMIN) 50 MG tablet Take 1 tablet (50 mg total) by mouth daily.  30 tablet  11  . Loratadine-Pseudoephedrine (CLARITIN-D 24 HOUR PO) Take 1 tablet by mouth daily.      Marland Kitchen losartan (COZAAR) 50 MG tablet Take 1 tablet (50 mg total) by mouth daily.  30 tablet  11  . Naproxen Sodium (ALEVE) 220 MG CAPS Take 2 capsules by mouth daily.        Allergies  Tetracyclines & related  Electrocardiogram:  06/03/12  SR rate 95  Poor R wave progression.    Assessment and Plan

## 2012-09-10 ENCOUNTER — Ambulatory Visit
Admission: RE | Admit: 2012-09-10 | Discharge: 2012-09-10 | Disposition: A | Discharge status: Home / Self Care | Payer: Medicare HMO | Source: Ambulatory Visit | Attending: Family Medicine | Admitting: Family Medicine

## 2012-09-10 DIAGNOSIS — Z1231 Encounter for screening mammogram for malignant neoplasm of breast: Secondary | ICD-10-CM

## 2012-09-10 DIAGNOSIS — Z780 Asymptomatic menopausal state: Secondary | ICD-10-CM

## 2012-09-13 ENCOUNTER — Telehealth: Payer: Self-pay | Admitting: Family Medicine

## 2012-09-13 NOTE — Telephone Encounter (Signed)
Advised appointment to discuss her DEXA results.

## 2012-09-14 ENCOUNTER — Telehealth: Payer: Self-pay | Admitting: Family Medicine

## 2012-09-14 DIAGNOSIS — R928 Other abnormal and inconclusive findings on diagnostic imaging of breast: Secondary | ICD-10-CM

## 2012-09-14 NOTE — Telephone Encounter (Signed)
[phone] (home) [phone] (work) Elease Hashimoto talked with patient about results. Orders placed for diagnostic mammogram and [place] if needed, urgent. Pt ok with proceeding with these.

## 2012-09-14 NOTE — Telephone Encounter (Signed)
Called and spoke with pt and pt is aware that an appt is needed. Pt states she just received those results in the mail and she will call the office back to make an appt.

## 2012-09-28 ENCOUNTER — Other Ambulatory Visit: Payer: Self-pay | Admitting: Family Medicine

## 2012-09-28 ENCOUNTER — Ambulatory Visit
Admission: RE | Admit: 2012-09-28 | Discharge: 2012-09-28 | Disposition: A | Discharge status: Home / Self Care | Payer: Medicare HMO | Source: Ambulatory Visit | Attending: Family Medicine | Admitting: Family Medicine

## 2012-09-28 ENCOUNTER — Telehealth: Payer: Self-pay | Admitting: Family Medicine

## 2012-09-28 DIAGNOSIS — R921 Mammographic calcification found on diagnostic imaging of breast: Secondary | ICD-10-CM

## 2012-09-28 DIAGNOSIS — R928 Other abnormal and inconclusive findings on diagnostic imaging of breast: Secondary | ICD-10-CM

## 2012-09-28 NOTE — Telephone Encounter (Signed)
LM for patient to call our office. Abnormal diagnostic mammogram. Recs for breast biopsy. Referral placed. Alisha, please continue to try to contact this patient. Per report pt has already been told about results and the need for a biopsy. Just want to let her know referral was placed.

## 2012-09-28 NOTE — Addendum Note (Signed)
Addended by: [name] on: 09/28/2012 04:21 PM   Modules accepted: Orders

## 2012-09-28 NOTE — Telephone Encounter (Signed)
Talked with pt and she already has biopsy scheduled at breast center. Referral order canceled.

## 2012-10-11 ENCOUNTER — Ambulatory Visit
Admission: RE | Admit: 2012-10-11 | Discharge: 2012-10-11 | Disposition: A | Discharge status: Home / Self Care | Payer: Medicare HMO | Source: Ambulatory Visit | Attending: Family Medicine | Admitting: Family Medicine

## 2012-10-11 ENCOUNTER — Other Ambulatory Visit: Payer: Self-pay | Admitting: Family Medicine

## 2012-10-11 DIAGNOSIS — R928 Other abnormal and inconclusive findings on diagnostic imaging of breast: Secondary | ICD-10-CM

## 2012-10-12 ENCOUNTER — Telehealth: Payer: Self-pay | Admitting: Cardiovascular Disease

## 2012-10-12 NOTE — Telephone Encounter (Signed)
SPOKE WITH  PT  IS COMPLAINING  OF DIZZINESS EVEN  WHEN LYING IN BED  HAS PROGRESSIVELY  WORSENED   B/P RUNNING  IN TH 140'S/ 70'S  PER PT TAKES  ATENOLOL AND LOSARTAN   IN THE EVENING  INSTRUCTED MAYBE COULD TAKE LOSARTAN IN AM AND  ATENOLOL IN THE PM WILL FORWARD TO DR Eden Emms FOR  REVIEW .Zack Seal

## 2012-10-12 NOTE — Telephone Encounter (Signed)
New problem:   C/O side effect from losartan 100 mg  making her dizzy.

## 2012-10-13 ENCOUNTER — Telehealth: Payer: Self-pay | Admitting: Family Medicine

## 2012-10-13 ENCOUNTER — Telehealth: Payer: Self-pay | Admitting: Internal Medicine

## 2012-10-13 NOTE — Telephone Encounter (Signed)
Cut losartin to 50 mg and see if this helps

## 2012-10-13 NOTE — Telephone Encounter (Signed)
error 

## 2012-10-13 NOTE — Telephone Encounter (Signed)
Nurse attempted to call pt.  No answer.  Left message.

## 2012-10-14 NOTE — Telephone Encounter (Signed)
Pt states she called a few times yesterday.  Pt states she is really dizzy and she thinks it is from the hypertension medication (losartan).  Pt states the card from 50 mg to 100 mg and it is progressively getting worse.  Pt states she can lay down and the room is spinning and this makes her nauseous. Pt states she has contacted her cardiologist office and told and was told it may be inner ear problems.  Pt states this this strange that this would happen when the medication changed. Pt states she will not go back to the cardiologist and was told that Dr. Selena Batten would pick up blood pressure.   Advised pt to make an appt; pt states she cannot come into the office until after Christmas due to finances.  Pt advised to come in sooner or go to ER if symptoms worsen.  Pt is aware of appt date and time.

## 2012-10-14 NOTE — Progress Notes (Signed)
Quick Note:  Called and spoke with pt and pt states that she was contacted about results. ______

## 2012-10-15 NOTE — Telephone Encounter (Signed)
PT AWARE TO DECREASE LOSARTAN  TO  50 MG  WILL MONITOR B/P FOR  APPROX WEEK  AND CALL  WITH  B/P READINGS .Zack Seal

## 2012-10-21 ENCOUNTER — Encounter: Payer: Self-pay | Admitting: Family Medicine

## 2012-10-21 ENCOUNTER — Ambulatory Visit (INDEPENDENT_AMBULATORY_CARE_PROVIDER_SITE_OTHER): Payer: Medicare HMO | Admitting: Family Medicine

## 2012-10-21 VITALS — BP 130/90 | Temp 98.1°F | Ht 66.5 in | Wt 182.0 lb

## 2012-10-21 DIAGNOSIS — I10 Essential (primary) hypertension: Secondary | ICD-10-CM

## 2012-10-21 DIAGNOSIS — M810 Age-related osteoporosis without current pathological fracture: Secondary | ICD-10-CM

## 2012-10-21 DIAGNOSIS — K219 Gastro-esophageal reflux disease without esophagitis: Secondary | ICD-10-CM

## 2012-10-21 MED ORDER — PANTOPRAZOLE SODIUM 20 MG PO TBEC: 20.0000 mg | DELAYED_RELEASE_TABLET | Freq: Every day | ORAL | Status: DC

## 2012-10-21 MED ORDER — HYDROCHLOROTHIAZIDE 12.5 MG PO CAPS: 12.5000 mg | ORAL_CAPSULE | Freq: Every day | ORAL | Status: DC

## 2012-10-21 MED ORDER — ALENDRONATE SODIUM 70 MG PO TABS: 70.0000 mg | ORAL_TABLET | ORAL | Status: DC

## 2012-10-21 NOTE — Patient Instructions (Addendum)
FOR YOUR OSTEOPOROSIS/INCREASED RISK FOR FRACTURE: -please get regular weight bearing exercise -please eat a healthy diet -please take vitamin D (1000 IU daily) and calcium (1200mg ) daily -As we discussed, we have prescribed a new medication (FOSAMAX) for you at this appointment. We discussed the common and serious potential adverse effects of this medication and you can review these and more with the pharmacist when you pick up your medication.  Please follow the instructions for use carefully and notify us immediately if you have any problems taking this medication.   FOR you BLOOD PRESSURE: -stop the losartan -As we discussed, we have prescribed a new medication (HYDROCHLOROTHIAZIDE) for you at this appointment. We discussed the common and serious potential adverse effects of this medication and you can review these and more with the pharmacist when you pick up your medication.  Please follow the instructions for use carefully and notify us immediately if you have any problems taking this medication.  FOR YOUR REFLUX: -Start the protonix back up  Follow up in 1 month

## 2012-10-21 NOTE — Progress Notes (Signed)
Chief Complaint  Patient presents with  . Follow-up    blood pressure medication    HPI:  Here for follow up for HTN:  HTN/HX VSD as child/Dizziness: -was seeing cards and recent med changes caused dizziness (had echo recently) -stopped smoking in Oct -has had reported swelling on norvasc pr ROC -per notes pt reported dizziness and SOB with starting losartan and cards recs decrease dose and to take on BP med in am and one in pm -looks like from our notes she increased losartan? She feels this is side effect from this medication. -currently taking atenolol and reports has always been fine on this, now on 50mg  daily of losartan and symptoms of dizziness and SOB have improved some, but she wants to try a different medication -denies: CP, SOB currently, fevers, HA, Swelling  Osteoporosis: -per recent dexa -vitamin D and calcium: -exercise: none -FH: in mother, shoulder fx, grandmother with hip fx  GERD: -reflux symptoms have been worse for several weeks -used to be on protonix, wants to restart -denies bowel changes, dysphagia  ROS: See pertinent positives and negatives per HPI.  Past Medical History  Diagnosis Date  . Cholecystitis   . HYPERTENSION   . Allergic rhinitis due to other allergen   . GERD   . LOW BACK PAIN   . ELECTROCARDIOGRAM, ABNORMAL   . Murmur     Family History  Problem Relation Age of Onset  . Diabetes    . Hypertension      History   Social History  . Marital Status: Married    Spouse Name: N/A    Number of Children: N/A  . Years of Education: N/A   Social History Main Topics  . Smoking status: Former Smoker -- 1.0 packs/day    Types: Cigarettes    Quit date: 08/01/2012  . Smokeless tobacco: None  . Alcohol Use: No  . Drug Use: No  . Sexually Active: None   Other Topics Concern  . None   Social History Narrative  . None    Current outpatient prescriptions:atenolol (TENORMIN) 50 MG tablet, Take 1 tablet (50 mg total) by mouth  daily., Disp: 30 tablet, Rfl: 11;  Loratadine-Pseudoephedrine (CLARITIN-D 24 HOUR PO), Take 1 tablet by mouth daily., Disp: , Rfl: ;  Naproxen Sodium (ALEVE) 220 MG CAPS, Take 2 capsules by mouth daily., Disp: , Rfl:  alendronate (FOSAMAX) 70 MG tablet, Take 1 tablet (70 mg total) by mouth every 7 (seven) days. Take with a full glass of water on an empty stomach., Disp: 4 tablet, Rfl: 11;  hydrochlorothiazide (MICROZIDE) 12.5 MG capsule, Take 1 capsule (12.5 mg total) by mouth daily., Disp: 90 capsule, Rfl: 3;  pantoprazole (PROTONIX) 20 MG tablet, Take 1 tablet (20 mg total) by mouth daily., Disp: 90 tablet, Rfl: 1  EXAM:  Filed Vitals:   10/21/12 1338  BP: 130/90  Temp: 98.1 F (36.7 C)  O2 96% on RA  Body mass index is 28.94 kg/(m^2).  GENERAL: vitals reviewed and listed above, alert, oriented, appears well hydrated and in no acute distress  HEENT: atraumatic, conjunttiva clear, no obvious abnormalities on inspection of external nose and ears  NECK: no obvious masses on inspection  LUNGS: clear to auscultation bilaterally, no wheezes, rales or rhonchi, good air movement  CV: HRRR, no peripheral edema  ABD: BS+, NTTP  MS: moves all extremities without noticeable abnormality  PSYCH: pleasant and cooperative, no obvious depression or anxiety  ASSESSMENT AND PLAN:  Discussed the following assessment  and plan:  1. HYPERTENSION  hydrochlorothiazide (MICROZIDE) 12.5 MG capsule  2. Osteoporosis  alendronate (FOSAMAX) 70 MG tablet  3. GERD  pantoprazole (PROTONIX) 20 MG tablet   -for HTN: pt convince side effects to losartan (SOB, dizziness). Will stop this med and trial of diuretic. -discussed DEXA results and advised tx. Discussed options and risks/benefits. Pt decided on Vit D, Ca and fosamax. Repeat DEXA in 2 years -follow up in: 1 month -Patient advised to return or notify a doctor immediately if symptoms worsen or persist or new concerns arise.  Patient Instructions  FOR  YOUR OSTEOPOROSIS/INCREASED RISK FOR FRACTURE: -please get regular weight bearing exercise -please eat a healthy diet -please take vitamin D (1000 IU daily) and calcium (1200mg ) daily -As we discussed, we have prescribed a new medication (FOSAMAX) for you at this appointment. We discussed the common and serious potential adverse effects of this medication and you can review these and more with the pharmacist when you pick up your medication.  Please follow the instructions for use carefully and notify us immediately if you have any problems taking this medication.   FOR you BLOOD PRESSURE: -stop the losartan -As we discussed, we have prescribed a new medication (HYDROCHLOROTHIAZIDE) for you at this appointment. We discussed the common and serious potential adverse effects of this medication and you can review these and more with the pharmacist when you pick up your medication.  Please follow the instructions for use carefully and notify us immediately if you have any problems taking this medication.  FOR YOUR REFLUX: -Start the protonix back up  Follow up in 1 month      Sanay Belmar R.

## 2012-12-06 ENCOUNTER — Ambulatory Visit: Payer: Medicare HMO | Admitting: Cardiovascular Disease

## 2012-12-11 ENCOUNTER — Other Ambulatory Visit: Payer: Self-pay

## 2013-04-18 ENCOUNTER — Other Ambulatory Visit: Payer: Self-pay | Admitting: Family Medicine

## 2013-06-01 ENCOUNTER — Telehealth: Payer: Self-pay | Admitting: Family Medicine

## 2013-06-01 ENCOUNTER — Ambulatory Visit (INDEPENDENT_AMBULATORY_CARE_PROVIDER_SITE_OTHER): Payer: Medicare HMO | Admitting: Family Medicine

## 2013-06-01 ENCOUNTER — Encounter: Payer: Self-pay | Admitting: Family Medicine

## 2013-06-01 ENCOUNTER — Other Ambulatory Visit: Payer: Self-pay

## 2013-06-01 VITALS — BP 150/100 | Temp 98.2°F | Wt 182.0 lb

## 2013-06-01 DIAGNOSIS — M810 Age-related osteoporosis without current pathological fracture: Secondary | ICD-10-CM

## 2013-06-01 DIAGNOSIS — K219 Gastro-esophageal reflux disease without esophagitis: Secondary | ICD-10-CM

## 2013-06-01 DIAGNOSIS — I10 Essential (primary) hypertension: Secondary | ICD-10-CM

## 2013-06-01 HISTORY — DX: Age-related osteoporosis without current pathological fracture: M81.0

## 2013-06-01 MED ORDER — ATENOLOL 50 MG PO TABS: 50.0000 mg | ORAL_TABLET | Freq: Every day | ORAL | Status: DC

## 2013-06-01 MED ORDER — AMLODIPINE BESYLATE 5 MG PO TABS: 5.0000 mg | ORAL_TABLET | Freq: Every day | ORAL | Status: DC

## 2013-06-01 MED ORDER — ALENDRONATE SODIUM 10 MG PO TABS: 10.0000 mg | ORAL_TABLET | Freq: Every day | ORAL | Status: DC

## 2013-06-01 NOTE — Telephone Encounter (Signed)
Rx sent 

## 2013-06-01 NOTE — Progress Notes (Signed)
Chief Complaint  Patient presents with  . follow up on bp medicaiton    HPI:  Follow up: (note: last visit in December and told to follow up in 1 month, this is 8 months later)  HTN: -has seen cards in the past (Dr. Eden Emms) -losartan stopped last visit as pt sure was making her dizzy -she stopped hctz because reports she did not tolerate - mad her dizzy -current meds: ASA, atenolol 50 -denies: CP, SOB, DOE, swelling, palpitations  Osteoporosis: -was to take Vit D, cal and fosamax, dexa in 2016 and get regular weight baring exercise -she stopped the fosomax - tried it once and made her sick so she stopped it -not exercising, has exercise machine in house but does not use it  GERD: -restarted PPI last visit  Health maintenance: -due for annual exam in 08/16/13  ROS: See pertinent positives and negatives per HPI.  Past Medical History  Diagnosis Date  . Cholecystitis   . HYPERTENSION   . Allergic rhinitis due to other allergen   . GERD   . ELECTROCARDIOGRAM, ABNORMAL   . LOW BACK PAIN 01/02/2010    Qualifier: Diagnosis of  By: Yetta Barre MD, Bernadene Bell.   . Osteoporosis, dexa 2014, fosamax, cal, vit D and exercise advised 06/01/2013  . TOBACCO USE, quit sept 2013 01/02/2010    Qualifier: Diagnosis of  By: Yetta Barre MD, Bernadene Bell.   . Murmur, hx VSD, evaluated by cardiology in 2013 06/03/2012    Family History  Problem Relation Age of Onset  . Diabetes    . Hypertension      History   Social History  . Marital Status: Married    Spouse Name: N/A    Number of Children: N/A  . Years of Education: N/A   Social History Main Topics  . Smoking status: Former Smoker -- 1.00 packs/day    Types: Cigarettes    Quit date: 08/01/2012  . Smokeless tobacco: None  . Alcohol Use: No  . Drug Use: No  . Sexually Active: None   Other Topics Concern  . None   Social History Narrative  . None    Current outpatient prescriptions:Ascorbic Acid (VITAMIN C PO), Take by mouth daily., Disp: ,  Rfl: ;  aspirin 81 MG tablet, Take 81 mg by mouth daily., Disp: , Rfl: ;  atenolol (TENORMIN) 50 MG tablet, Take 1 tablet (50 mg total) by mouth daily., Disp: 30 tablet, Rfl: 11;  Cholecalciferol (VITAMIN D PO), Take by mouth daily., Disp: , Rfl: ;  Loratadine-Pseudoephedrine (CLARITIN-D 24 HOUR PO), Take 1 tablet by mouth daily., Disp: , Rfl:  Naproxen Sodium (ALEVE) 220 MG CAPS, Take 2 capsules by mouth daily., Disp: , Rfl: ;  pantoprazole (PROTONIX) 20 MG tablet, TAKE ONE TABLET BY MOUTH EVERY DAY, Disp: 90 tablet, Rfl: 0;  alendronate (FOSAMAX) 10 MG tablet, Take 1 tablet (10 mg total) by mouth daily before breakfast. Take with a full glass of water on an empty stomach., Disp: 90 tablet, Rfl: 3 amLODipine (NORVASC) 5 MG tablet, Take 1 tablet (5 mg total) by mouth daily., Disp: 90 tablet, Rfl: 3  EXAM:  Filed Vitals:   06/01/13 1019  BP: 150/100  Temp: 98.2 F (36.8 C)    Body mass index is 28.94 kg/(m^2).  GENERAL: vitals reviewed and listed above, alert, oriented, appears well hydrated and in no acute distress  HEENT: atraumatic, conjunttiva clear, no obvious abnormalities on inspection of external nose and ears  NECK: no obvious masses on  inspection  LUNGS: clear to auscultation bilaterally, no wheezes, rales or rhonchi, good air movement  CV: HRRR, no peripheral edema  MS: moves all extremities without noticeable abnormality  PSYCH: pleasant and cooperative, no obvious depression or anxiety  ASSESSMENT AND PLAN:  Discussed the following assessment and plan:  Osteoporosis, dexa 2014, fosamac, cal, vit D and exercise - Plan: alendronate (FOSAMAX) 10 MG tablet  HYPERTENSION - Plan: amLODipine (NORVASC) 5 MG tablet  GERD   -advised to schedule annual exam in Nov 2014 -advised not to take naproxen daily  -she often makes her own decision to top medications and does not follow recommendations -advised she needs to call us whenever changing medications -changing to daily  low dose fosamax  -trial of norvasc -follow up with blood pressure cuf fin 1 month -Patient advised to return or notify a doctor immediately if symptoms worsen or persist or new concerns arise.  Patient Instructions  -stop the naproxen  -use tylenol 500-1000mg  up to 3 times daily as needed for pain  -start fosamax low dose daily  -start norvasc (amlodipine) for blood pressure  -We recommend the following healthy lifestyle measures: - eat a healthy diet consisting of lots of vegetables, fruits, beans, nuts, seeds, healthy meats such as white chicken and fish and whole grains.  - avoid fried foods, fast food, processed foods, sodas, red meet and other fattening foods.  - get a least 150 minutes of aerobic exercise per week.   -follow up  In one month for blood pressure (BRING your blood pressure cuff) and in November for your physical exam       [name].

## 2013-06-01 NOTE — Telephone Encounter (Signed)
PT called and stated that she did not receive a refill of her atenolol (TENORMIN) 50 MG tablet, when seen by Dr. Selena Batten today, and she would like one called in ASAP. [facility] on [address]. Please assist.

## 2013-06-01 NOTE — Patient Instructions (Addendum)
-  stop the naproxen  -use tylenol 500-1000mg  up to 3 times daily as needed for pain  -start fosamax low dose daily  -start norvasc (amlodipine) for blood pressure  -We recommend the following healthy lifestyle measures: - eat a healthy diet consisting of lots of vegetables, fruits, beans, nuts, seeds, healthy meats such as white chicken and fish and whole grains.  - avoid fried foods, fast food, processed foods, sodas, red meet and other fattening foods.  - get a least 150 minutes of aerobic exercise per week.   -follow up  In one month for blood pressure (BRING your blood pressure cuff) and in November for your physical exam

## 2013-07-16 ENCOUNTER — Other Ambulatory Visit: Payer: Self-pay | Admitting: Family Medicine

## 2013-09-01 ENCOUNTER — Other Ambulatory Visit: Payer: Self-pay

## 2013-09-30 ENCOUNTER — Telehealth: Payer: Self-pay | Admitting: Family Medicine

## 2013-09-30 MED ORDER — ATENOLOL 50 MG PO TABS: 50.0000 mg | ORAL_TABLET | Freq: Every day | ORAL | Status: DC

## 2013-09-30 NOTE — Telephone Encounter (Signed)
Pt request refill atenolol (TENORMIN) 50 MG tablet (90 day)  1 / day [facility]

## 2013-09-30 NOTE — Telephone Encounter (Signed)
Rx sent to pharmacy; pt needs to be seen for future refills.

## 2013-10-13 ENCOUNTER — Other Ambulatory Visit: Payer: Self-pay | Admitting: Family Medicine

## 2013-10-14 NOTE — Telephone Encounter (Signed)
Called and spoke with pt and pt states she will call after the beginning of the year to make an appt because she was just seen in August.  Advised pt that an rx would be sent but she needed to be seen before more refills are needed.

## 2013-10-14 NOTE — Telephone Encounter (Signed)
Needs follow up - refill to appt

## 2013-11-28 ENCOUNTER — Telehealth: Payer: Self-pay | Admitting: Family Medicine

## 2013-11-28 MED ORDER — ATENOLOL 50 MG PO TABS: 50.0000 mg | ORAL_TABLET | Freq: Every day | ORAL | Status: DC

## 2013-11-28 NOTE — Telephone Encounter (Signed)
Called and spoke with pt and pt is aware of appt on 12/26/13 at 8:45 am.  Rx sent to pharmacy.

## 2013-11-28 NOTE — Telephone Encounter (Signed)
Pt would like a refill on atenolol 50 mg. Pt decline appt due to flu season.

## 2013-11-28 NOTE — Telephone Encounter (Signed)
Needs appt. Has been august since we saw her and advised follow up in 1 month at that time. Can schedule appt in next 1 month and refill to get to that appt.

## 2013-12-26 ENCOUNTER — Ambulatory Visit (INDEPENDENT_AMBULATORY_CARE_PROVIDER_SITE_OTHER): Payer: Medicare HMO | Admitting: Family Medicine

## 2013-12-26 ENCOUNTER — Encounter: Payer: Self-pay | Admitting: Family Medicine

## 2013-12-26 VITALS — BP 130/88 | Temp 98.9°F | Wt 180.0 lb

## 2013-12-26 DIAGNOSIS — K219 Gastro-esophageal reflux disease without esophagitis: Secondary | ICD-10-CM

## 2013-12-26 DIAGNOSIS — M810 Age-related osteoporosis without current pathological fracture: Secondary | ICD-10-CM

## 2013-12-26 DIAGNOSIS — I10 Essential (primary) hypertension: Secondary | ICD-10-CM

## 2013-12-26 MED ORDER — PANTOPRAZOLE SODIUM 40 MG PO TBEC: DELAYED_RELEASE_TABLET | ORAL | Status: DC

## 2013-12-26 NOTE — Progress Notes (Signed)
Chief Complaint  Patient presents with  . Follow-up    HPI:  Follow up: (note: last visit in august and told to follow up in 1 month, this is 7 months later)  HTN: -has seen cards in the past (Dr. Eden Emms) -losartan and hctz made her dizzy and she stopped in the past -at last visit started norvasc and she was to follow up in 1 month with home BP readings  -reports home readings SBP in 1teens to 160 -current meds: ASA, atenolol 50 -denies: CP, SOB, DOE, swelling, palpitations  Osteoporosis: -was to take Vit D, cal and fosamax, dexa in 2016 and get regular weight baring exercise -she stopped the fosomax - tried it once and made her sick so she stopped it prior to last visit -started low dose fosamax last visit -not exercising, has exercise machine in house but does not use it  GERD: -restarted PPI last year - taking protonix 20mg  daily -reflux and heartburn has returned and had bad case of heartburn after boiled cabbage -has symptoms a few times per week, worse at night -denies: dysphagia, dysphonia   Health maintenance: -due for annual exam in 08/16/13 - advised to schedule several times.  ROS: See pertinent positives and negatives per HPI.  Past Medical History  Diagnosis Date  . Cholecystitis   . HYPERTENSION   . Allergic rhinitis due to other allergen   . GERD   . ELECTROCARDIOGRAM, ABNORMAL   . LOW BACK PAIN 01/02/2010    Qualifier: Diagnosis of  By: Yetta Barre MD, Bernadene Bell.   . Osteoporosis, dexa 2014, fosamax, cal, vit D and exercise advised 06/01/2013  . TOBACCO USE, quit sept 2013 01/02/2010    Qualifier: Diagnosis of  By: Yetta Barre MD, Bernadene Bell.   . Murmur, hx VSD, evaluated by cardiology in 2013 06/03/2012    Family History  Problem Relation Age of Onset  . Diabetes    . Hypertension      History   Social History  . Marital Status: Married    Spouse Name: N/A    Number of Children: N/A  . Years of Education: N/A   Social History Main Topics  . Smoking status:  Former Smoker -- 1.00 packs/day    Types: Cigarettes    Quit date: 08/01/2012  . Smokeless tobacco: None  . Alcohol Use: No  . Drug Use: No  . Sexual Activity: None   Other Topics Concern  . None   Social History Narrative  . None    Current outpatient prescriptions:alendronate (FOSAMAX) 10 MG tablet, Take 1 tablet (10 mg total) by mouth daily before breakfast. Take with a full glass of water on an empty stomach., Disp: 90 tablet, Rfl: 3;  amLODipine (NORVASC) 5 MG tablet, Take 1 tablet (5 mg total) by mouth daily., Disp: 90 tablet, Rfl: 3;  Ascorbic Acid (VITAMIN C PO), Take by mouth daily., Disp: , Rfl: ;  aspirin 81 MG tablet, Take 81 mg by mouth daily., Disp: , Rfl:  atenolol (TENORMIN) 50 MG tablet, Take 1 tablet (50 mg total) by mouth daily., Disp: 30 tablet, Rfl: 0;  Cholecalciferol (VITAMIN D PO), Take by mouth daily., Disp: , Rfl: ;  Loratadine-Pseudoephedrine (CLARITIN-D 24 HOUR PO), Take 1 tablet by mouth daily., Disp: , Rfl: ;  Naproxen Sodium (ALEVE) 220 MG CAPS, Take 2 capsules by mouth daily., Disp: , Rfl:  pantoprazole (PROTONIX) 40 MG tablet, TAKE ONE TABLET BY MOUTH ONCE DAILY, Disp: 90 tablet, Rfl: 3  EXAM:  Filed Vitals:  12/26/13 0847  BP: 130/88  Temp: 98.9 F (37.2 C)    Body mass index is 28.62 kg/(m^2).  GENERAL: vitals reviewed and listed above, alert, oriented, appears well hydrated and in no acute distress  HEENT: atraumatic, conjunttiva clear, no obvious abnormalities on inspection of external nose and ears  NECK: no obvious masses on inspection  LUNGS: clear to auscultation bilaterally, no wheezes, rales or rhonchi, good air movement  CV: HRRR, no peripheral edema  MS: moves all extremities without noticeable abnormality  PSYCH: pleasant and cooperative, no obvious depression or anxiety  ASSESSMENT AND PLAN:  Discussed the following assessment and plan:  GERD - Plan: pantoprazole (PROTONIX) 40 MG tablet  Osteoporosis, dexa 2014,  fosamax, cal, vit D and exercise advised  HYPERTENSION  147/87 on her cuff after sitting and 130/88 on our cuff -advised to schedule annual exam in Nov 2014, she did not -advised not to take naproxen daily  -advised to increase protonix and follow anti-GERD diet and follow up for this and physical in one month -needs health maintenance exam -refused vaccine today, follow up in 1 month for this -needs labs but she wants to do these at her physical and refused today -Patient advised to return or notify a doctor immediately if symptoms worsen or persist or new concerns arise.  There are no Patient Instructions on file for this visit.   Melissa Nixon, Melissa Rodd R.

## 2013-12-26 NOTE — Patient Instructions (Signed)
-  increase protonix to 40mg  daily  -follow up in 1 month for physical exam and follow up  Diet for Gastroesophageal Reflux Disease, Adult Reflux (acid reflux) is when acid from your stomach flows up into the esophagus. When acid comes in contact with the esophagus, the acid causes irritation and soreness (inflammation) in the esophagus. When reflux happens often or so severely that it causes damage to the esophagus, it is called gastroesophageal reflux disease (GERD). Nutrition therapy can help ease the discomfort of GERD. FOODS OR DRINKS TO AVOID OR LIMIT  Smoking or chewing tobacco. Nicotine is one of the most potent stimulants to acid production in the gastrointestinal tract.  Caffeinated and decaffeinated coffee and black tea.  Regular or low-calorie carbonated beverages or energy drinks (caffeine-free carbonated beverages are allowed).   Strong spices, such as black pepper, white pepper, red pepper, cayenne, curry powder, and chili powder.  Peppermint or spearmint.  Chocolate.  High-fat foods, including meats and fried foods. Extra added fats including oils, butter, salad dressings, and nuts. Limit these to less than 8 tsp per day.  Fruits and vegetables if they are not tolerated, such as citrus fruits or tomatoes.  Alcohol.  Any food that seems to aggravate your condition. If you have questions regarding your diet, call your caregiver or a registered dietitian. OTHER THINGS THAT MAY HELP GERD INCLUDE:   Eating your meals slowly, in a relaxed setting.  Eating 5 to 6 small meals per day instead of 3 large meals.  Eliminating food for a period of time if it causes distress.  Not lying down until 3 hours after eating a meal.  Keeping the head of your bed raised 6 to 9 inches (15 to 23 cm) by using a foam wedge or blocks under the legs of the bed. Lying flat may make symptoms worse.  Being physically active. Weight loss may be helpful in reducing reflux in overweight or obese  adults.  Wear loose fitting clothing EXAMPLE MEAL PLAN This meal plan is approximately 2,000 calories based on https://www.[facility].org/ meal planning guidelines. Breakfast   cup cooked oatmeal.  1 cup strawberries.  1 cup low-fat milk.  1 oz almonds. Snack  1 cup cucumber slices.  6 oz yogurt (made from low-fat or fat-free milk). Lunch  2 slice whole-wheat bread.  2 oz sliced [place].  2 tsp mayonnaise.  1 cup blueberries.  1 cup snap peas. Snack  6 whole-wheat crackers.  1 oz string cheese. Dinner   cup brown rice.  1 cup mixed veggies.  1 tsp olive oil.  3 oz grilled fish. Document Released: 10/13/2005 Document Revised: 01/05/2012 Document Reviewed: 08/29/2011 [facility] Patient Information 2014 [place], [place].

## 2013-12-26 NOTE — Progress Notes (Signed)
Pre visit review using our clinic review tool, if applicable. No additional management support is needed unless otherwise documented below in the visit note. 

## 2013-12-27 ENCOUNTER — Other Ambulatory Visit: Payer: Self-pay | Admitting: Family Medicine

## 2013-12-27 ENCOUNTER — Telehealth: Payer: Self-pay | Admitting: Family Medicine

## 2013-12-27 NOTE — Telephone Encounter (Signed)
Relevant patient education assigned to patient using Emmi. ° °

## 2014-01-20 ENCOUNTER — Inpatient Hospital Stay (HOSPITAL_COMMUNITY)
Admission: EM | Admit: 2014-01-20 | Discharge: 2014-01-22 | DRG: 247 | Disposition: A | Discharge status: Home / Self Care | Payer: Medicare HMO | Attending: Cardiovascular Disease | Admitting: Cardiovascular Disease

## 2014-01-20 ENCOUNTER — Encounter (HOSPITAL_COMMUNITY)
Admission: EM | Disposition: A | Discharge status: Home / Self Care | Payer: Commercial Managed Care - HMO | Source: Home / Self Care | Attending: Cardiovascular Disease

## 2014-01-20 ENCOUNTER — Encounter (HOSPITAL_COMMUNITY): Payer: Self-pay | Admitting: Emergency Medicine

## 2014-01-20 DIAGNOSIS — M810 Age-related osteoporosis without current pathological fracture: Secondary | ICD-10-CM | POA: Diagnosis present

## 2014-01-20 DIAGNOSIS — R110 Nausea: Secondary | ICD-10-CM | POA: Diagnosis not present

## 2014-01-20 DIAGNOSIS — Z720 Tobacco use: Secondary | ICD-10-CM | POA: Diagnosis present

## 2014-01-20 DIAGNOSIS — R7309 Other abnormal glucose: Secondary | ICD-10-CM | POA: Diagnosis present

## 2014-01-20 DIAGNOSIS — Z87891 Personal history of nicotine dependence: Secondary | ICD-10-CM

## 2014-01-20 DIAGNOSIS — R011 Cardiac murmur, unspecified: Secondary | ICD-10-CM | POA: Diagnosis present

## 2014-01-20 DIAGNOSIS — I2109 ST elevation (STEMI) myocardial infarction involving other coronary artery of anterior wall: Secondary | ICD-10-CM

## 2014-01-20 DIAGNOSIS — I10 Essential (primary) hypertension: Secondary | ICD-10-CM | POA: Diagnosis present

## 2014-01-20 DIAGNOSIS — E785 Hyperlipidemia, unspecified: Secondary | ICD-10-CM | POA: Diagnosis present

## 2014-01-20 DIAGNOSIS — M545 Low back pain, unspecified: Secondary | ICD-10-CM | POA: Diagnosis present

## 2014-01-20 DIAGNOSIS — I519 Heart disease, unspecified: Secondary | ICD-10-CM | POA: Diagnosis present

## 2014-01-20 DIAGNOSIS — I213 ST elevation (STEMI) myocardial infarction of unspecified site: Secondary | ICD-10-CM | POA: Diagnosis present

## 2014-01-20 DIAGNOSIS — R739 Hyperglycemia, unspecified: Secondary | ICD-10-CM | POA: Diagnosis present

## 2014-01-20 DIAGNOSIS — I2510 Atherosclerotic heart disease of native coronary artery without angina pectoris: Secondary | ICD-10-CM | POA: Diagnosis present

## 2014-01-20 DIAGNOSIS — Z7982 Long term (current) use of aspirin: Secondary | ICD-10-CM

## 2014-01-20 DIAGNOSIS — K219 Gastro-esophageal reflux disease without esophagitis: Secondary | ICD-10-CM | POA: Diagnosis present

## 2014-01-20 DIAGNOSIS — I2582 Chronic total occlusion of coronary artery: Secondary | ICD-10-CM | POA: Diagnosis present

## 2014-01-20 DIAGNOSIS — I4510 Unspecified right bundle-branch block: Secondary | ICD-10-CM | POA: Diagnosis present

## 2014-01-20 HISTORY — DX: Unspecified right bundle-branch block: I45.10

## 2014-01-20 HISTORY — DX: Hyperlipidemia, unspecified: E78.5

## 2014-01-20 HISTORY — PX: CORONARY ANGIOPLASTY WITH STENT PLACEMENT: SHX49

## 2014-01-20 HISTORY — DX: ST elevation (STEMI) myocardial infarction of unspecified site: I21.3

## 2014-01-20 HISTORY — PX: LEFT HEART CATH: SHX5478

## 2014-01-20 HISTORY — DX: Hyperglycemia, unspecified: R73.9

## 2014-01-20 HISTORY — DX: Heart disease, unspecified: I51.9

## 2014-01-20 HISTORY — DX: Atherosclerotic heart disease of native coronary artery without angina pectoris: I25.10

## 2014-01-20 HISTORY — DX: Tobacco use: Z72.0

## 2014-01-20 LAB — COMPREHENSIVE METABOLIC PANEL
ALT: 22 U/L (ref 0–35)
AST: 51 U/L — AB (ref 0–37)
Albumin: 4.4 g/dL (ref 3.5–5.2)
Alkaline Phosphatase: 69 U/L (ref 39–117)
BILIRUBIN TOTAL: 0.5 mg/dL (ref 0.3–1.2)
BUN: 11 mg/dL (ref 6–23)
CALCIUM: 10.6 mg/dL — AB (ref 8.4–10.5)
CHLORIDE: 97 meq/L (ref 96–112)
CO2: 20 meq/L (ref 19–32)
CREATININE: 0.87 mg/dL (ref 0.50–1.10)
GFR, EST AFRICAN AMERICAN: 79 mL/min — AB (ref >90)
GFR, EST NON AFRICAN AMERICAN: 68 mL/min — AB (ref >90)
GLUCOSE: 181 mg/dL — AB (ref 70–99)
Potassium: 3.7 mEq/L (ref 3.7–5.3)
Sodium: 135 mEq/L — ABNORMAL LOW (ref 137–147)
Total Protein: 8.9 g/dL — ABNORMAL HIGH (ref 6.0–8.3)

## 2014-01-20 LAB — CBC
HEMATOCRIT: 43.5 % (ref 36.0–46.0)
HEMOGLOBIN: 15.6 g/dL — AB (ref 12.0–15.0)
MCH: 30.4 pg (ref 26.0–34.0)
MCHC: 35.9 g/dL (ref 30.0–36.0)
MCV: 84.8 fL (ref 78.0–100.0)
PLATELETS: 394 10*3/uL (ref 150–400)
RBC: 5.13 MIL/uL — AB (ref 3.87–5.11)
RDW: 12.9 % (ref 11.5–15.5)
WBC: 19.1 10*3/uL — AB (ref 4.0–10.5)

## 2014-01-20 LAB — I-STAT TROPONIN, ED: Troponin i, poc: 5.93 ng/mL (ref 0.00–0.08)

## 2014-01-20 LAB — APTT: aPTT: 29 seconds (ref 24–37)

## 2014-01-20 LAB — PROTIME-INR
INR: 0.93 (ref 0.00–1.49)
Prothrombin Time: 12.3 seconds (ref 11.6–15.2)

## 2014-01-20 SURGERY — LEFT HEART CATH
Anesthesia: LOCAL

## 2014-01-20 MED ORDER — ASPIRIN EC 81 MG PO TBEC: 81.0000 mg | DELAYED_RELEASE_TABLET | Freq: Every day | ORAL | Status: DC

## 2014-01-20 MED ORDER — METOPROLOL TARTRATE 12.5 MG HALF TABLET
12.5000 mg | ORAL_TABLET | Freq: Three times a day (TID) | ORAL | Status: DC
  Filled 2014-01-20: qty 1

## 2014-01-20 MED ORDER — BIVALIRUDIN 250 MG IV SOLR
1.7500 mg/kg/h | INTRAVENOUS | Status: AC
  Administered 2014-01-20: 1.75 mg/kg/h via INTRAVENOUS
  Filled 2014-01-20 (×2): qty 250

## 2014-01-20 MED ORDER — HEPARIN (PORCINE) IN NACL 100-0.45 UNIT/ML-% IJ SOLN
900.0000 [IU]/h | INTRAMUSCULAR | Status: DC
  Filled 2014-01-20: qty 250

## 2014-01-20 MED ORDER — SODIUM CHLORIDE 0.9 % IV SOLN
INTRAVENOUS | Status: DC
  Administered 2014-01-20 – 2014-01-21 (×2): via INTRAVENOUS

## 2014-01-20 MED ORDER — NITROGLYCERIN 0.4 MG SL SUBL
0.4000 mg | SUBLINGUAL_TABLET | SUBLINGUAL | Status: DC | PRN
  Administered 2014-01-20: 0.4 mg via SUBLINGUAL
  Filled 2014-01-20: qty 1

## 2014-01-20 MED ORDER — ASPIRIN EC 81 MG PO TBEC
81.0000 mg | DELAYED_RELEASE_TABLET | Freq: Every day | ORAL | Status: DC
  Administered 2014-01-21 – 2014-01-22 (×2): 81 mg via ORAL
  Filled 2014-01-20 (×2): qty 1

## 2014-01-20 MED ORDER — ATORVASTATIN CALCIUM 40 MG PO TABS
40.0000 mg | ORAL_TABLET | Freq: Every day | ORAL | Status: DC
  Administered 2014-01-21: 40 mg via ORAL
  Filled 2014-01-20 (×3): qty 1

## 2014-01-20 MED ORDER — LOSARTAN POTASSIUM 25 MG PO TABS
25.0000 mg | ORAL_TABLET | Freq: Every day | ORAL | Status: DC
  Administered 2014-01-21 – 2014-01-22 (×2): 25 mg via ORAL
  Filled 2014-01-20 (×2): qty 1

## 2014-01-20 MED ORDER — TIROFIBAN HCL IV 12.5 MG/250 ML
0.1500 ug/kg/min | INTRAVENOUS | Status: AC
  Administered 2014-01-20 – 2014-01-21 (×2): 0.15 ug/kg/min via INTRAVENOUS
  Filled 2014-01-20: qty 250

## 2014-01-20 MED ORDER — SODIUM CHLORIDE 0.9 % IV SOLN
1.7500 mg/kg/h | INTRAVENOUS | Status: DC
  Filled 2014-01-20: qty 250

## 2014-01-20 MED ORDER — TICAGRELOR 90 MG PO TABS
90.0000 mg | ORAL_TABLET | Freq: Two times a day (BID) | ORAL | Status: DC
  Administered 2014-01-21 – 2014-01-22 (×3): 90 mg via ORAL
  Filled 2014-01-20 (×6): qty 1

## 2014-01-20 MED ORDER — SODIUM CHLORIDE 0.9 % IV SOLN: 0.1500 mg/kg/h | INTRAVENOUS | Status: DC

## 2014-01-20 MED ORDER — MORPHINE SULFATE 4 MG/ML IJ SOLN
INTRAMUSCULAR | Status: AC
  Administered 2014-01-20: 4 mg
  Filled 2014-01-20: qty 1

## 2014-01-20 MED ORDER — PANTOPRAZOLE SODIUM 40 MG PO TBEC
40.0000 mg | DELAYED_RELEASE_TABLET | Freq: Every day | ORAL | Status: DC
  Administered 2014-01-21 – 2014-01-22 (×2): 40 mg via ORAL
  Filled 2014-01-20 (×2): qty 1

## 2014-01-20 MED ORDER — NITROGLYCERIN IN D5W 200-5 MCG/ML-% IV SOLN
2.0000 ug/min | INTRAVENOUS | Status: DC
  Administered 2014-01-20: 10 ug/min via INTRAVENOUS

## 2014-01-20 MED ORDER — NITROGLYCERIN 0.4 MG SL SUBL: 0.4000 mg | SUBLINGUAL_TABLET | SUBLINGUAL | Status: DC | PRN

## 2014-01-20 MED ORDER — SODIUM CHLORIDE 0.9 % IV SOLN
INTRAVENOUS | Status: DC
  Administered 2014-01-20: 1000 mL via INTRAVENOUS

## 2014-01-20 MED ORDER — ASPIRIN 81 MG PO CHEW
324.0000 mg | CHEWABLE_TABLET | Freq: Once | ORAL | Status: AC
  Administered 2014-01-20: 324 mg via ORAL
  Filled 2014-01-20: qty 4

## 2014-01-20 MED ORDER — INSULIN ASPART 100 UNIT/ML ~~LOC~~ SOLN
0.0000 [IU] | Freq: Three times a day (TID) | SUBCUTANEOUS | Status: DC
  Administered 2014-01-21 – 2014-01-22 (×2): 1 [IU] via SUBCUTANEOUS

## 2014-01-20 MED ORDER — HEPARIN SODIUM (PORCINE) 5000 UNIT/ML IJ SOLN
5000.0000 [IU] | Freq: Three times a day (TID) | INTRAMUSCULAR | Status: DC
  Filled 2014-01-20: qty 1

## 2014-01-20 MED ORDER — HEPARIN BOLUS VIA INFUSION
4000.0000 [IU] | Freq: Once | INTRAVENOUS | Status: AC
  Administered 2014-01-20: 4000 [IU] via INTRAVENOUS
  Filled 2014-01-20: qty 4000

## 2014-01-20 MED ORDER — METOPROLOL TARTRATE 25 MG PO TABS
25.0000 mg | ORAL_TABLET | Freq: Two times a day (BID) | ORAL | Status: DC
  Administered 2014-01-21 – 2014-01-22 (×3): 25 mg via ORAL
  Filled 2014-01-20 (×6): qty 1

## 2014-01-20 MED ORDER — HEPARIN SODIUM (PORCINE) 5000 UNIT/ML IJ SOLN
4000.0000 [IU] | INTRAMUSCULAR | Status: DC
  Filled 2014-01-20: qty 0.8

## 2014-01-20 NOTE — ED Provider Notes (Addendum)
CSN: [id number]     Arrival date & time 01/20/14  2018 History   First MD Initiated Contact with Patient 01/20/14 2032     QR:FXJOI pain Patient is a 68 y.o. female presenting with chest pain. The history is provided by the patient.  Chest Pain Pain location:  Substernal area Pain quality: tightness   Pain radiates to:  L jaw Pain radiates to the back: no   Pain severity:  Severe Duration: It started yesterday then went away.  Started again around 5-6 pm. Timing:  Constant Progression:  Worsening Chronicity:  New Relieved by:  Nothing Worsened by:  Nothing tried Associated symptoms: nausea and shortness of breath   Associated symptoms: no fever and not vomiting   Risk factors: hypertension and smoking   Risk factors: no coronary artery disease, no diabetes mellitus and no prior DVT/PE     Past Medical History  Diagnosis Date  . Cholecystitis   . HYPERTENSION   . Allergic rhinitis due to other allergen   . GERD   . ELECTROCARDIOGRAM, ABNORMAL   . LOW BACK PAIN 01/02/2010    Qualifier: Diagnosis of  By: Yetta Barre MD, Bernadene Bell.   . Osteoporosis, dexa 2014, fosamax, cal, vit D and exercise advised 06/01/2013  . TOBACCO USE, quit sept 2013 01/02/2010    Qualifier: Diagnosis of  By: Yetta Barre MD, Bernadene Bell.   . Murmur, hx VSD, evaluated by cardiology in 2013 06/03/2012   Past Surgical History  Procedure Laterality Date  . Laparoscopic cholecystectomy    . Abdominal hysterectomy  1983   Family History  Problem Relation Age of Onset  . Diabetes    . Hypertension     History  Substance Use Topics  . Smoking status: Former Smoker -- 1.00 packs/day    Types: Cigarettes    Quit date: 08/01/2012  . Smokeless tobacco: Not on file  . Alcohol Use: No   OB History   Grav Para Term Preterm Abortions TAB SAB Ect Mult Living                 Review of Systems  Constitutional: Negative for fever.  Respiratory: Positive for shortness of breath.   Cardiovascular: Positive for chest pain.   Gastrointestinal: Positive for nausea. Negative for vomiting.  All other systems reviewed and are negative.      Allergies  Tetracyclines & related  Home Medications   Current Outpatient Rx  Name  Route  Sig  Dispense  Refill  . alendronate (FOSAMAX) 10 MG tablet   Oral   Take 1 tablet (10 mg total) by mouth daily before breakfast. Take with a full glass of water on an empty stomach.   90 tablet   3   . amLODipine (NORVASC) 5 MG tablet   Oral   Take 1 tablet (5 mg total) by mouth daily.   90 tablet   3   . Ascorbic Acid (VITAMIN C PO)   Oral   Take by mouth daily.         Marland Kitchen aspirin 81 MG tablet   Oral   Take 81 mg by mouth daily.         Marland Kitchen atenolol (TENORMIN) 50 MG tablet      TAKE ONE TABLET BY MOUTH ONCE DAILY   90 tablet   3   . Cholecalciferol (VITAMIN D PO)   Oral   Take by mouth daily.         . Loratadine-Pseudoephedrine (CLARITIN-D 24 HOUR PO)  Oral   Take 1 tablet by mouth daily.         . Naproxen Sodium (ALEVE) 220 MG CAPS   Oral   Take 2 capsules by mouth daily.         . pantoprazole (PROTONIX) 40 MG tablet      TAKE ONE TABLET BY MOUTH ONCE DAILY   90 tablet   3    BP 167/82  Pulse 69  Resp 31  Ht 5\' 6"  (1.676 m)  Wt 170 lb (77.111 kg)  BMI 27.45 kg/m2  SpO2 100% Physical Exam  Nursing note and vitals reviewed. Constitutional: No distress.  HENT:  Head: Normocephalic and atraumatic.  Right Ear: External ear normal.  Left Ear: External ear normal.  Eyes: Conjunctivae are normal. Right eye exhibits no discharge. Left eye exhibits no discharge. No scleral icterus.  Neck: Neck supple. No tracheal deviation present.  Cardiovascular: Normal rate, regular rhythm and intact distal pulses.   Pulmonary/Chest: Effort normal and breath sounds normal. No stridor. No respiratory distress. She has no wheezes. She has no rales.  Abdominal: Soft. Bowel sounds are normal. She exhibits no distension. There is no tenderness. There is  no rebound and no guarding.  Musculoskeletal: She exhibits no edema and no tenderness.  Neurological: She is alert. She has normal strength. No cranial nerve deficit (no facial droop, extraocular movements intact, no slurred speech) or sensory deficit. She exhibits normal muscle tone. She displays no seizure activity. Coordination normal.  Skin: Skin is warm and dry. No rash noted. She is not diaphoretic. There is pallor.  Psychiatric: She has a normal mood and affect.    ED Course  Procedures (including critical care time)  CRITICAL CARE Performed by: Linwood Dibbles R Total critical care time:30 Critical care time was exclusive of separately billable procedures and treating other patients. Critical care was necessary to treat or prevent imminent or life-threatening deterioration. Critical care was time spent personally by me on the following activities: development of treatment plan with patient and/or surrogate as well as nursing, discussions with consultants, evaluation of patient's response to treatment, examination of patient, obtaining history from patient or surrogate, ordering and performing treatments and interventions, ordering and review of laboratory studies, ordering and review of radiographic studies, pulse oximetry and re-evaluation of patient's condition.  Labs Review Labs Reviewed  I-STAT TROPOININ, ED - Abnormal; Notable for the following:    Troponin i, poc 5.93 (*)    All other components within normal limits  APTT  CBC  COMPREHENSIVE METABOLIC PANEL  PROTIME-INR   Imaging Review No results found. Medications  0.9 %  sodium chloride infusion (1,000 mLs Intravenous New Bag/Given 01/20/14 2044)  nitroGLYCERIN (NITROSTAT) SL tablet 0.4 mg (0.4 mg Sublingual Given 01/20/14 2049)  heparin ADULT infusion 100 units/mL (25000 units/250 mL) (not administered)  aspirin chewable tablet 324 mg (324 mg Oral Given 01/20/14 2035)  morphine 4 MG/ML injection (4 mg  Given 01/20/14 2043)   heparin bolus via infusion 4,000 Units (4,000 Units Intravenous New Bag/Given 01/20/14 2052)    EKG Sinus rhythm, rate 69 St elevation anterior V1-V3, reciprocal changes inferior  EKG time 20:27 , Code stemi activated at 2032.  Spoke with Dr Tresa Endo via carelink approximately 2040.  MDM   Final diagnoses:  STEMI (ST elevation myocardial infarction)    Pt's EKG show acute st elevation.  Changed compared to prior EKG.  Code stemi activated.  Vital signs remain stable.  Pt will be emergently transferred to [facility].  Cath lab has been notified.     Celene KrasJon R Alohilani Levenhagen, MD 01/20/14 [id number]  Celene KrasJon R Hosey Burmester, MD 01/20/14 2124

## 2014-01-20 NOTE — CV Procedure (Signed)
Melissa Nixon is a 68 y.o. female    [id number]  [id number] LOCATION:  FACILITY: [facility]  PHYSICIAN: Lennette Bihari, MD, [facility] 11/04/1945   DATE OF PROCEDURE:  01/20/2014    CARDIAC CATHETERIZATION     HISTORY: Melissa Nixon is a 68 year old female with a history of hypertension as well as prior tobacco use. She developed  chest pain intermittently yesterday. This evening  she developed recurrent chest pain. She presented to [facility] and a code stemi was called. ECG showed anterior ST segment elevation. She presents to the cardiac catheterization laboratory for emergent intervention.   PROCEDURE: Upon arrival to the cardiac catheterization laboratory she was but she was still experiencing 6/10 chest pain. She had received 4000 units of heparin prior to arrival. Her right groin was prepped and draped in usual fashion. She was given Versed 2 mg and fentanyl 25 mcg Versed conscious sedation. A 6 French sheath was inserted into the right femoral artery without difficulty. Diagnostic catheterization was done using 6 French Judkins left and right diagnostic catheters. With the demonstration of total proximal LAD occlusion with thrombus, intervention was performed. A 6 French XB LAD 3.5 guide was used for the procedure.  Angiomax bolus plus infusion was administered. Brilinta 180 mg was given for antiplatelet therapy. The Asahi medium wire was advanced the LAD. ACT was documented to be therapeutic. Initial dilatation was done with a sprinter 2.0x12 mm balloon at 3,5 and 7 atmospheres. This did restore blood flow but there was evidence for significant thrombus burden. A priority one thrombectomy catheter was then inserted and two thrombectomy runs were made down the LAD with significant thrombus removal. A 3.5x20 mm Promus Premier DES stent was inserted with careful attention to place the stent at the LAD ostium just beyond the takeoff of the ramus intermediate vessel but the distal aspect ending just prior to  the takeoff of the first septal perforator artery. Post-stent dilatation was done with a Erick Euphora balloon dilated up to 3.66 mm. Angiography confirmed an excellent angiographic result with Timi 3 flow extending just proximal to the LV apex. However, at the apex there was still residual clot in a small caliber apical LAD portion. For this reason, Aggrastat was administered in bolus plus infusion and the patient will be maintained on Angiomax for 4 hours post procedure with plans for Aggrastat for 18 hours. The arterial sheath was sutured in place. She was started on low dose IV nitroglycerin. She left the catheterization laboratory pain-free with stable hemodynamics.  HEMODYNAMICS:   Central Aorta: 125/74   Left Ventricle: 125/14/27  ANGIOGRAPHY:  1. Left main: Angiographically normal and trifurcated into a LAD, ramus intermediate vessel, and left circumflex vessel.  2. LAD: Totally occluded just beyond the origin with thrombus and TIMI 0 flow 3. Ramus Intermediate: Angiographically normal 4. Left circumflex: Moderate size vessel with a 40% stenosis in an inferior branch of the first marginal vessel and 30% distal circumflex stenosis 5. Right coronary artery: Angiographically normal it gave rise to the PDA. There were no collaterals to the LAD.  Left ventriculography revealed the dysfunction with mid to distal anterolateral hypocontractility and ejection fraction of 45-50%.  Following percutaneous coronary intervention to the LAD with PTCA, thrombectomy with significant clot retrieval, and stenting with a 3.5x20 mm Promus Premier DES stent postdilated to 3.66 mm, the 100% occlusion was reduced to 0% there was resumption of brisk TIMI 3 flow. There was still evidence for distal embolization of thrombus at the apex.  IMPRESSION:  Acute anterior wall myocardial infarction secondary to total proximal LAD occlusion.  Normal ramus intermediate vessel, left circumflex coronary artery with 40% OM  stenosis and 30% distal stenosis, and normal RCA.  Mild acute LV dysfunction with an ejection fraction of 45-50% and mild to moderate mid distal anterolateral hypocontractility.  Successful percutaneous coronary intervention of the LAD proximally with PTCA, thrombectomy, DES stenting with a 3.5x20 mm Promus Premier DES stent postdilated 3.66 mm with resumption of TIMI 3 flow in the 100% occlusion reduced to 0%. However, there is still evidence for distal embolization of thrombus to the apical LAD for which tirofiban was started.  Angiomax/180 mg Brilinta/ICand IV nitroglycerin/tirofiban    Lennette Biharihomas A. Kaius Daino, MD, [facility] 01/20/2014 10:50 PM

## 2014-01-20 NOTE — H&P (Signed)
History and Physical  Patient ID: Blanchard KelchOuida J Laborde MRN: [id number], SOB: [birth year] 68 y.o. Date of Encounter: 01/20/2014, 9:00 PM  Primary Physician: Terressa KoyanagiKIM, HANNAH R., DO Primary Cardiologist: Eden EmmsNishan  Chief Complaint: chest pain --> STEMI  HPI: 68 y.o. female w/ PMHx significant for hypertension, GERD and tobacco abuse [facility] with complaints of chest pain. Reports that she had an episode yesterday(thought it was GERD) and then again today at 5:30 pm that has been constant since then. Associated with diaphoresis. Radiates to both arms and associated with shortness of breath.  Previously by seen by Dr. Eden EmmsNishan for HTN and followup of a restrictive VSD as a child (not seen on echo 2013).  EKG revealed NSR with RBBB, ST elevations in V1, V2, V3 and reciprocal changes inferiorly. CXray not done. Labs are significant for troponin of 6, wbc of 19.  Received heparin and aspirin in the ER.  No bleeding issues history. No upcoming surgeries. Still smoking.   Past Medical History  Diagnosis Date  . Cholecystitis   . HYPERTENSION   . Allergic rhinitis due to other allergen   . GERD   . ELECTROCARDIOGRAM, ABNORMAL   . LOW BACK PAIN 01/02/2010    Qualifier: Diagnosis of  By: Yetta BarreJones MD, Bernadene Bellhomas L.   . Osteoporosis, dexa 2014, fosamax, cal, vit D and exercise advised 06/01/2013  . TOBACCO USE, quit sept 2013 01/02/2010    Qualifier: Diagnosis of  By: Yetta BarreJones MD, Bernadene Bellhomas L.   . Murmur, hx VSD, evaluated by cardiology in 2013 06/03/2012  repeat echo 2013 without VSD   Surgical History:  Past Surgical History  Procedure Laterality Date  . Laparoscopic cholecystectomy    . Abdominal hysterectomy  1983     Home Meds: Prior to Admission medications   Medication Sig Start Date End Date Taking? Authorizing Provider  alendronate (FOSAMAX) 10 MG tablet Take 1 tablet (10 mg total) by mouth daily before breakfast. Take with a full glass of water on an empty stomach.  06/01/13   Terressa KoyanagiHannah R. Kim, DO  amLODipine (NORVASC) 5 MG tablet Take 1 tablet (5 mg total) by mouth daily. 06/01/13   Terressa KoyanagiHannah R. Kim, DO  Ascorbic Acid (VITAMIN C PO) Take by mouth daily.    Historical Provider, MD  aspirin 81 MG tablet Take 81 mg by mouth daily.    Historical Provider, MD  atenolol (TENORMIN) 50 MG tablet TAKE ONE TABLET BY MOUTH ONCE DAILY    Terressa KoyanagiHannah R. Kim, DO  Cholecalciferol (VITAMIN D PO) Take by mouth daily.    Historical Provider, MD  Loratadine-Pseudoephedrine (CLARITIN-D 24 HOUR PO) Take 1 tablet by mouth daily.    Historical Provider, MD  Naproxen Sodium (ALEVE) 220 MG CAPS Take 2 capsules by mouth daily.    Historical Provider, MD  pantoprazole (PROTONIX) 40 MG tablet TAKE ONE TABLET BY MOUTH ONCE DAILY 12/26/13   Terressa KoyanagiHannah R. Kim, DO    Allergies:  Allergies  Allergen Reactions  . Tetracyclines & Related     History   Social History  . Marital Status: Married    Spouse Name: N/A    Number of Children: N/A  . Years of Education: N/A   Occupational History  . Not on file.   Social History Main Topics  . Smoking status: Former Smoker -- 1.00 packs/day    Types: Cigarettes    Quit date: 08/01/2012  . Smokeless tobacco: Not on file  . Alcohol Use: No  . Drug  Use: No  . Sexual Activity: Not on file   Other Topics Concern  . Not on file   Social History Narrative  . No narrative on file     Family History  Problem Relation Age of Onset  . Diabetes    . Hypertension      Review of Systems: General: negative for chills, fever, night sweats or weight changes.  Cardiovascular: see HPI. Dermatological: negative for rash Respiratory: negative for cough or wheezing Urologic: negative for hematuria Abdominal: negative for nausea, vomiting, diarrhea, bright red blood per rectum, melena, or hematemesis Neurologic: negative for visual changes, syncope, or dizziness All other systems reviewed and are otherwise negative except as noted above.  Labs:   Lab  Results  Component Value Date   WBC 7.8 01/02/2010   HGB 13.9 01/02/2010   HCT 42.1 01/02/2010   MCV 93.6 01/02/2010   PLT 338.0 01/02/2010     Recent Labs Lab 01/20/14 2035  NA 135*  K 3.7  CL 97  CO2 20  BUN 11  CREATININE 0.87  CALCIUM 10.6*  PROT 8.9*  BILITOT 0.5  ALKPHOS 69  ALT 22  AST 51*  GLUCOSE 181*   No results found for this basename: CKTOTAL, CKMB, TROPONINI,  in the last 72 hours Lab Results  Component Value Date   CHOL 214* 07/16/2012   HDL 42.70 07/16/2012   TRIG 191.0* 07/16/2012   No results found for this basename: DDIMER    Radiology/Studies:  No results found.   EKG: sinus, RBBB with anterior ST elevation and reciprocal changes  Physical Exam: Blood pressure 155/80, pulse 67, resp. rate 25, height 5\' 6"  (1.676 m), weight 77.111 kg (170 lb), SpO2 100.00%. General: Well developed, well nourished, mild distress Head: Normocephalic, atraumatic, sclera non-icteric, nares are without discharge Neck: Supple. Negative for carotid bruits. JVD not elevated. Lungs: Clear bilaterally to auscultation without wheezes, rales, or rhonchi. Breathing is unlabored. Heart: RRR with S1 S2. No murmurs, rubs, or gallops appreciated. Abdomen: Soft, non-tender, non-distended with normoactive bowel sounds. No rebound/guarding. No obvious abdominal masses. Msk:  Strength and tone appear normal for age. Extremities: No edema. No clubbing or cyanosis. Distal pedal pulses are 2+ and equal bilaterally. Neuro: Alert and oriented X 3. Moves all extremities spontaneously. Psych:  Responds to questions appropriately with a normal affect.    ASSESSMENT AND PLAN:  Problem List 1. Anterior STEMI, symptom onset 3.5 hrs prior to presentation 2. HTN 3. Tobacco abuse 4. GERD 5. Osteoporesis 6. Elevated WBC --> acute phase reactant 2 to STEMI, elevated AST  7. Elevated CBG 8. Elevated Ca++ and protein   68 y.o. female w/ PMHx significant for hypertension, GERD and tobacco abuse who  presented to [facility] on 01/20/2014 with complaints of chest pain --> STEMI. Discussed risks and benefits of urgent catheterization and pt agreeable to proceed.  Heparin and aspirin given in ER --> urgently sent to [facility] cath lab. Right dominant coronaries, large thrombus in proximal LAD. (see procedure note for additional details.) Continue anticoagulation.  Start statin, switch beta blocker from atenolol to metoprolol, start ARB (previously dizzy with losartan/HCTZ, will start low and hold CCB). Avoid NSAIDs.  Elevated CBG --> check A1c  Check ionized Ca++.  Smoking Cessation.  Admit to CCU post cath. Echo in the AM.   PPI Heparin subq Full code Signed, Ahilyn Nell C. MD 01/20/2014, 9:00 PM

## 2014-01-20 NOTE — ED Notes (Signed)
Dr Lynelle Doctor aware of critical I stat Troponin

## 2014-01-20 NOTE — ED Notes (Signed)
EKG given to EDP,Knapp,J. For review. 

## 2014-01-20 NOTE — ED Notes (Signed)
Pt reports mid chest pain 6/10, radiating to both arms and to the jaw. Pt presents with shortness of breath, remarks on hx of htn, pt smokes, denies diabetes.

## 2014-01-20 NOTE — ED Notes (Signed)
Pt. On cardiac monitor. 

## 2014-01-21 ENCOUNTER — Encounter (HOSPITAL_COMMUNITY): Payer: Self-pay | Admitting: *Deleted

## 2014-01-21 DIAGNOSIS — E785 Hyperlipidemia, unspecified: Secondary | ICD-10-CM

## 2014-01-21 DIAGNOSIS — R110 Nausea: Secondary | ICD-10-CM

## 2014-01-21 DIAGNOSIS — I219 Acute myocardial infarction, unspecified: Secondary | ICD-10-CM

## 2014-01-21 LAB — CBC
HCT: 36.9 % (ref 36.0–46.0)
HCT: 38.5 % (ref 36.0–46.0)
HEMOGLOBIN: 13.3 g/dL (ref 12.0–15.0)
Hemoglobin: 13.9 g/dL (ref 12.0–15.0)
MCH: 30.7 pg (ref 26.0–34.0)
MCH: 30.8 pg (ref 26.0–34.0)
MCHC: 36 g/dL (ref 30.0–36.0)
MCHC: 36.1 g/dL — ABNORMAL HIGH (ref 30.0–36.0)
MCV: 85 fL (ref 78.0–100.0)
MCV: 85.4 fL (ref 78.0–100.0)
Platelets: 299 10*3/uL (ref 150–400)
Platelets: 308 10*3/uL (ref 150–400)
RBC: 4.32 MIL/uL (ref 3.87–5.11)
RBC: 4.53 MIL/uL (ref 3.87–5.11)
RDW: 13.1 % (ref 11.5–15.5)
RDW: 13.2 % (ref 11.5–15.5)
WBC: 14.1 10*3/uL — ABNORMAL HIGH (ref 4.0–10.5)
WBC: 13.6 10*3/uL — AB (ref 4.0–10.5)

## 2014-01-21 LAB — BASIC METABOLIC PANEL
BUN: 10 mg/dL (ref 6–23)
CHLORIDE: 98 meq/L (ref 96–112)
CO2: 21 meq/L (ref 19–32)
Calcium: 9 mg/dL (ref 8.4–10.5)
Creatinine, Ser: 0.58 mg/dL (ref 0.50–1.10)
GFR calc Af Amer: >90 mL/min (ref >90)
GFR calc non Af Amer: >90 mL/min (ref >90)
GLUCOSE: 155 mg/dL — AB (ref 70–99)
Potassium: 3.8 mEq/L (ref 3.7–5.3)
SODIUM: 134 meq/L — AB (ref 137–147)

## 2014-01-21 LAB — HEMOGLOBIN A1C
HEMOGLOBIN A1C: 6.1 % — AB (ref <5.7)
MEAN PLASMA GLUCOSE: 128 mg/dL — AB (ref <117)

## 2014-01-21 LAB — GLUCOSE, CAPILLARY
GLUCOSE-CAPILLARY: 136 mg/dL — AB (ref 70–99)
GLUCOSE-CAPILLARY: 130 mg/dL — AB (ref 70–99)
Glucose-Capillary: 127 mg/dL — ABNORMAL HIGH (ref 70–99)
Glucose-Capillary: 119 mg/dL — ABNORMAL HIGH (ref 70–99)

## 2014-01-21 LAB — CREATININE, SERUM
CREATININE: 0.65 mg/dL (ref 0.50–1.10)
GFR calc non Af Amer: >90 mL/min (ref >90)

## 2014-01-21 LAB — LIPID PANEL
CHOLESTEROL: 166 mg/dL (ref 0–200)
HDL: 41 mg/dL (ref >39)
LDL Cholesterol: 102 mg/dL — ABNORMAL HIGH (ref 0–99)
Total CHOL/HDL Ratio: 4 RATIO
Triglycerides: 117 mg/dL (ref <150)
VLDL: 23 mg/dL (ref 0–40)

## 2014-01-21 LAB — TROPONIN I: Troponin I: >20 ng/mL (ref <0.30)

## 2014-01-21 LAB — MRSA PCR SCREENING: MRSA by PCR: NEGATIVE

## 2014-01-21 LAB — CALCIUM, IONIZED: CALCIUM ION: 1.28 mmol/L (ref 1.13–1.30)

## 2014-01-21 MED ORDER — SODIUM CHLORIDE 0.9 % IV SOLN: INTRAVENOUS | Status: DC

## 2014-01-21 MED ORDER — ONDANSETRON HCL 4 MG PO TABS: 4.0000 mg | ORAL_TABLET | Freq: Three times a day (TID) | ORAL | Status: DC | PRN

## 2014-01-21 MED ORDER — ONDANSETRON HCL 4 MG/2ML IJ SOLN: 4.0000 mg | Freq: Four times a day (QID) | INTRAMUSCULAR | Status: DC | PRN

## 2014-01-21 MED ORDER — ATROPINE SULFATE 0.1 MG/ML IJ SOLN
INTRAMUSCULAR | Status: AC
  Filled 2014-01-21: qty 10

## 2014-01-21 NOTE — Progress Notes (Signed)
Sheath Removal:  Rt groin femoral sheath in place, level 0, all surrounding tissue soft to palpation. Rt pedal pulse 1+, VSS. 46fr Sheath removed, manual pressure held x69mins. Pt VSS throughout, pt tolerated well. Rt groin level 0, pedal pulse 1+, pressure dressing applied to site. Post instructions given to pt, pt demonstrates understanding. Will continue to monitor closely.

## 2014-01-21 NOTE — Progress Notes (Signed)
Pt temp 100.3 orally. Pt denies feeling hot, says she is just tired.  Refused tylenol when offered.  Will continue to monitor pt closely.

## 2014-01-21 NOTE — Progress Notes (Signed)
Pt unable to void after separate attempts to use the bedpan. Pt states that when she has been hospitalized in the past a catheter had to be inserted until she could get up to void. Bladder scan done, resulted >428mL. Bladder noted to be distended and pt very uncomfortable. MD order to insert foley.

## 2014-01-21 NOTE — Progress Notes (Signed)
CARDIAC REHAB PHASE I   PRE:  Rate/Rhythm: SR 92  BP:  Supine:   Sitting: 140/72  Standing:    SaO2: 94 RA  MODE:  Ambulation: 150  ft   POST:  Rate/Rhythm: ST 103  BP:  Supine:   Sitting: 149/62   Standing:     SaO2: 93 RA  Rt groin inspected soft with slight bruising.  No oozing at site or tenderness.  Pt assisted up to ambulate in hallway x 1 assist with rolling walker.  Pt gait slow and guarded.  Pt did experience some shortness of breath upon the return to her room.  This improved with rest and lowering the temperature in the room.  Noted possible discharge on tomorrow.  Pt and husband given instructions on heart healthy diet, exercise guidelines, NTG protocol, alert 911 for unrelieved cp, compliance with anti-platelet therapy.  Handouts provided along with Bouncing back heart attack booklet.  Pt reluctant regarding the cardiac rehab phase II program and plans to do silver sneakers which is paid completely by her medical insurance.  Pt given the brochure in case she desired to attend at a later time.  Pt requested to move back to the bed due to fatigue.  Call bell within reach side rails up. Alanson Aly, BSN  [phone]

## 2014-01-21 NOTE — Progress Notes (Signed)
    Subjective:  Other than nausea, doing ok. No CP. No SOB.   Objective:  Vital Signs in the last 24 hours: Temp:  [97.6 F (36.4 C)-98.8 F (37.1 C)] 98.8 F (37.1 C) (03/28 0830) Pulse Rate:  [67-81] 79 (03/28 1200) Resp:  [10-31] 16 (03/28 1200) BP: (112-167)/(60-92) 126/69 mmHg (03/28 1200) SpO2:  [91 %-100 %] 95 % (03/28 1200) Weight:  [170 lb (77.111 kg)-176 lb 9.4 oz (80.1 kg)] 176 lb 9.4 oz (80.1 kg) (03/28 0000)  Intake/Output from previous day: 03/27 0701 - 03/28 0700 In: 1368.9 [P.O.:240; I.V.:1128.9] Out: 665 [Urine:665]   Physical Exam: General: Well developed, well nourished, in no acute distress. Head:  Normocephalic and atraumatic. Lungs: Clear to auscultation and percussion. Heart: Normal S1 and S2.  No murmur, rubs or gallops.  Abdomen: soft, non-tender, positive bowel sounds. Extremities: No clubbing or cyanosis. No edema. Cath site no hematoma.  Neurologic: Alert and oriented x 3.    Lab Results:  Recent Labs  01/20/14 2359 01/21/14 0500  WBC 13.6* 14.1*  HGB 13.9 13.3  PLT 308 299    Recent Labs  01/20/14 2035 01/20/14 2359 01/21/14 0500  NA 135*  --  134*  K 3.7  --  3.8  CL 97  --  98  CO2 20  --  21  GLUCOSE 181*  --  155*  BUN 11  --  10  CREATININE 0.87 0.65 0.58    Recent Labs  01/21/14 0800  TROPONINI >20.00*   Hepatic Function Panel  Recent Labs  01/20/14 2035  PROT 8.9*  ALBUMIN 4.4  AST 51*  ALT 22  ALKPHOS 69  BILITOT 0.5    Recent Labs  01/21/14 0500  CHOL 166  LDL 102  Telemetry: no VT Personally viewed.   EKG:  Lat ST elevation at 2252 on 3/27.   Cardiac Studies:  Prox LAD DES. Distal embolization of thrombus to distal LAD  . aspirin EC  81 mg Oral Daily  . atorvastatin  40 mg Oral q1800  . insulin aspart  0-9 Units Subcutaneous TID WC  . losartan  25 mg Oral Daily  . metoprolol tartrate  25 mg Oral BID  . pantoprazole  40 mg Oral Daily  . Ticagrelor  90 mg Oral BID    Assessment/Plan:   Active Problems:   STEMI (ST elevation myocardial infarction)  1) Anterior MI - 18 hours of tirofiban with distal LAD embolization  - Brilinta, ASA, metop 25 BID, ARB.   - No arrhythmia  - Mild nausea - Zofran. Was nauseous all day yesterday. Monitor.    2) Hyperlipidemia  - Goal LDL 70. Statin  3) RBBB - chronic  4) History of VSD as child, no evidence on 2013 ECHO. Dr. Eden Emms.   Non smoker.   Consider DC late tomorrow or Monday if no VT, stable.   Blinda Turek 01/21/2014, 12:21 PM

## 2014-01-22 ENCOUNTER — Encounter (HOSPITAL_COMMUNITY): Payer: Self-pay | Admitting: Cardiology

## 2014-01-22 DIAGNOSIS — I519 Heart disease, unspecified: Secondary | ICD-10-CM

## 2014-01-22 DIAGNOSIS — Z720 Tobacco use: Secondary | ICD-10-CM | POA: Diagnosis present

## 2014-01-22 DIAGNOSIS — I4510 Unspecified right bundle-branch block: Secondary | ICD-10-CM

## 2014-01-22 DIAGNOSIS — I10 Essential (primary) hypertension: Secondary | ICD-10-CM

## 2014-01-22 DIAGNOSIS — R739 Hyperglycemia, unspecified: Secondary | ICD-10-CM | POA: Diagnosis present

## 2014-01-22 DIAGNOSIS — E785 Hyperlipidemia, unspecified: Secondary | ICD-10-CM

## 2014-01-22 DIAGNOSIS — I2510 Atherosclerotic heart disease of native coronary artery without angina pectoris: Secondary | ICD-10-CM

## 2014-01-22 HISTORY — DX: Hyperglycemia, unspecified: R73.9

## 2014-01-22 HISTORY — DX: Atherosclerotic heart disease of native coronary artery without angina pectoris: I25.10

## 2014-01-22 HISTORY — DX: Heart disease, unspecified: I51.9

## 2014-01-22 HISTORY — DX: Tobacco use: Z72.0

## 2014-01-22 HISTORY — DX: Hyperlipidemia, unspecified: E78.5

## 2014-01-22 HISTORY — DX: Unspecified right bundle-branch block: I45.10

## 2014-01-22 LAB — GLUCOSE, CAPILLARY
Glucose-Capillary: 125 mg/dL — ABNORMAL HIGH (ref 70–99)
Glucose-Capillary: 110 mg/dL — ABNORMAL HIGH (ref 70–99)

## 2014-01-22 MED ORDER — METOPROLOL TARTRATE 25 MG PO TABS
25.0000 mg | ORAL_TABLET | Freq: Two times a day (BID) | ORAL | Status: DC
Start: 2014-01-22 — End: 2014-03-23

## 2014-01-22 MED ORDER — TICAGRELOR 90 MG PO TABS: 90.0000 mg | ORAL_TABLET | Freq: Two times a day (BID) | ORAL | Status: DC

## 2014-01-22 MED ORDER — NITROGLYCERIN 0.4 MG SL SUBL: 0.4000 mg | SUBLINGUAL_TABLET | SUBLINGUAL | Status: DC | PRN

## 2014-01-22 MED ORDER — LOSARTAN POTASSIUM 25 MG PO TABS: 25.0000 mg | ORAL_TABLET | Freq: Every day | ORAL | Status: DC

## 2014-01-22 MED ORDER — ATORVASTATIN CALCIUM 40 MG PO TABS: 40.0000 mg | ORAL_TABLET | Freq: Every day | ORAL | Status: DC

## 2014-01-22 NOTE — Discharge Summary (Signed)
Physician Discharge Summary       Patient ID: Melissa Nixon MRN: [id number] DOB/AGE: Jan 10, 1946 68 y.o.  Admit date: 01/20/2014 Discharge date: 01/22/2014  Discharge Diagnoses:  Principal Problem:   STEMI (ST elevation myocardial infarction), 01/20/14, LAD PCI/Stent DES Promus preimer, tirofiban given for distal embolization of thrombus Active Problems:   CAD, multiple vessel, residual disease LCX 40% OM   Hyperlipidemia LDL goal < 70   HYPERTENSION   Murmur, hx VSD, evaluated by cardiology in 2013   RBBB, chronic   Tobacco use, stopped in 2013   LV dysfunction, EF at cath 45-50%   Hyperglycemia   Discharged Condition: good  Primary Cardiologist: Dr. Eden Emms  Procedures: 01/20/14 emergent cath for STEMI with DES stent to 100% occluded LAD.  Hospital Course: 68 y.o. female w/ PMHx significant for hypertension, GERD and tobacco abuse who presented to [facility] on 01/20/2014 with complaints of chest pain. Reports that she had an episode yesterday(thought it was GERD) and then again today at 5:30 pm that has been constant since then. Associated with diaphoresis. Radiates to both arms and associated with shortness of breath. EKG revealed NSR with RBBB, ST elevations in V1, V2, V3 and reciprocal changes inferiorly. CXray not done. Labs are significant for troponin of 6, wbc of 19. Received heparin and aspirin in the ER.  Previously by seen by Dr. Eden Emms for HTN and followup of a restrictive VSD as a child (not seen on echo 2013).      Taken emergently to cath lab:   Acute anterior wall myocardial infarction secondary to total proximal LAD occlusion.  Normal ramus intermediate vessel, left circumflex coronary artery with 40% OM stenosis and 30% distal stenosis, and normal RCA.   Mild acute LV dysfunction with an ejection fraction of 45-50% and mild to moderate mid distal anterolateral hypocontractility.  Successful percutaneous coronary intervention of the LAD proximally with  PTCA, thrombectomy, DES stenting with a 3.5x20 mm Promus Premier DES stent postdilated 3.66 mm with resumption of TIMI 3 flow in the 100% occlusion reduced to 0%. However, there is still evidence for distal embolization of thrombus to the apical LAD for which tirofiban was started.  1.  STEMI (ST elevation myocardial infarction), 01/20/14, LAD PCI/Stent DES Promus preimer, tirofiban given for distal embolization of thrombus, continue asa and Brilinta, 30 day free card given.  Pk troponin > 20.   2.  CAD, multiple vessel, residual disease LCX 40% OM continue medical therapy  3.   Hyperlipidemia LDL goal < 70 on statin  4.   HYPERTENSION controlled  5.   Murmur, hx VSD, evaluated by cardiology in 2013  6.   LV dysfunction, EF at cath 45-50% on ARB  7.   Hyperglycemia and elevated HgB A1C add diabetic diet will need to follow up with PCP  8.   Nausea, still with solids at discharge if it continues she will call out office.  She progressed well during hospitalization. She was seen by Dr. Anne Fu and found ready for discharge.  She will follow up with Dr. Eden Emms.       Consults: cardiology  Significant Diagnostic Studies:  BMET    Component Value Date/Time   NA 134* 01/21/2014 0500   K 3.8 01/21/2014 0500   CL 98 01/21/2014 0500   CO2 21 01/21/2014 0500   GLUCOSE 155* 01/21/2014 0500   BUN 10 01/21/2014 0500   CREATININE 0.58 01/21/2014 0500   CALCIUM 9.0 01/21/2014 0500   GFRNONAA >90 01/21/2014  0500   GFRAA >90 01/21/2014 0500    CBC    Component Value Date/Time   WBC 14.1* 01/21/2014 0500   RBC 4.32 01/21/2014 0500   HGB 13.3 01/21/2014 0500   HCT 36.9 01/21/2014 0500   PLT 299 01/21/2014 0500   MCV 85.4 01/21/2014 0500   MCH 30.8 01/21/2014 0500   MCHC 36.0 01/21/2014 0500   RDW 13.2 01/21/2014 0500   LYMPHSABS 1.9 01/02/2010 0000   MONOABS 0.5 01/02/2010 0000   EOSABS 0.2 01/02/2010 0000   BASOSABS 0.0 01/02/2010 0000    troponin >20.00   T chol 166, TG 117, HDL 41 LDL 102  Hgb A1C  6.1 Hyperglycemia    Discharge Exam: Blood pressure 131/73, pulse 94, temperature 99.5 F (37.5 C), temperature source Oral, resp. rate 23, height 5\' 7"  (1.702 m), weight 176 lb 9.4 oz (80.1 kg), SpO2 95.00%.    Disposition: Home       Future Appointments Provider Department Dept Phone   01/26/2014 9:30 AM [name], DO [facility] [phone]       Medication List    STOP taking these medications       amLODipine 5 MG tablet  Commonly known as:  NORVASC     atenolol 50 MG tablet  Commonly known as:  TENORMIN     naproxen sodium 220 MG tablet  Commonly known as:  ANAPROX      TAKE these medications       alendronate 10 MG tablet  Commonly known as:  FOSAMAX  Take 1 tablet (10 mg total) by mouth daily before breakfast. Take with a full glass of water on an empty stomach.     aspirin 81 MG tablet  Take 81 mg by mouth daily.     atorvastatin 40 MG tablet  Commonly known as:  LIPITOR  Take 1 tablet (40 mg total) by mouth daily at 6 PM.     CLARITIN-D 24 HOUR PO  Take 1 tablet by mouth daily.     losartan 25 MG tablet  Commonly known as:  COZAAR  Take 1 tablet (25 mg total) by mouth daily.     metoprolol tartrate 25 MG tablet  Commonly known as:  LOPRESSOR  Take 1 tablet (25 mg total) by mouth 2 (two) times daily.     nitroGLYCERIN 0.4 MG SL tablet  Commonly known as:  NITROSTAT  Place 1 tablet (0.4 mg total) under the tongue every 5 (five) minutes x 3 doses as needed for chest pain.     pantoprazole 40 MG tablet  Commonly known as:  PROTONIX  Take 40 mg by mouth daily.     Ticagrelor 90 MG Tabs tablet  Commonly known as:  BRILINTA  Take 1 tablet (90 mg total) by mouth 2 (two) times daily.     VITAMIN C PO  Take 1 tablet by mouth daily.     VITAMIN D PO  Take 1 tablet by mouth daily.       Follow-up Information   Follow up with Charlton Haws, MD. (the office will call Monday with date and time)    Specialty:  Cardiology    Contact information:   [address] [place] [address] [phone]        Discharge Instructions: Call [facility] street  [phone] if any bleeding, swelling or drainage at cath site.  May shower, no tub baths for 48 hours for groin sticks.  No lifting over 5 pounds until seen in office.  No driving and no work until seen in the office.  Take 1 NTG, under your tongue, while sitting.  If no relief of pain may repeat NTG, one tab every 5 minutes up to 3 tablets total over 15 minutes.  If no relief CALL 911.  If you have dizziness/lightheadness  while taking NTG, stop taking and call 911.        Do not stop Brilinta.  Heart Healthy diet and diabetic.  You need to see your primary physician for your borderline diabetes.  Very important to adjust diet.   Signed: Leone BrandINGOLD,Marshell Dilauro R Nurse Practitioner-Certified [facility]: [facility] 01/22/2014, 12:50 PM  Time spent on discharge :45 minutes.

## 2014-01-22 NOTE — Progress Notes (Signed)
     Subjective:  Nausea improved, advancing diet. She wants to go home and see her labs (dogs). Husband patient of Dr. Shirlee Latch and Excell Seltzer. No CP. No SOB. Ambulating well. Temp isolated 100.3, no other focal symptoms, back down.   Objective:  Vital Signs in the last 24 hours: Temp:  [98.6 F (37 C)-100.3 F (37.9 C)] 99.5 F (37.5 C) (03/29 0000) Pulse Rate:  [79-108] 94 (03/29 0000) Resp:  [16-33] 23 (03/29 0000) BP: (126-156)/(55-93) 139/55 mmHg (03/29 0600) SpO2:  [93 %-96 %] 95 % (03/29 0400)  Intake/Output from previous day: 03/28 0701 - 03/29 0700 In: 1792 [P.O.:520; I.V.:1272] Out: 2050 [Urine:2050]   Physical Exam: General: Well developed, well nourished, in no acute distress. Head:  Normocephalic and atraumatic. Lungs: Clear to auscultation and percussion. Heart: Normal S1 and S2.  No murmur, rubs or gallops.  Abdomen: soft, non-tender, positive bowel sounds. Extremities: No clubbing or cyanosis. No edema. Cath site no hematoma.  Neurologic: Alert and oriented x 3.    Lab Results:  Recent Labs  01/20/14 2359 01/21/14 0500  WBC 13.6* 14.1*  HGB 13.9 13.3  PLT 308 299    Recent Labs  01/20/14 2035 01/20/14 2359 01/21/14 0500  NA 135*  --  134*  K 3.7  --  3.8  CL 97  --  98  CO2 20  --  21  GLUCOSE 181*  --  155*  BUN 11  --  10  CREATININE 0.87 0.65 0.58    Recent Labs  01/21/14 0800 01/21/14 1405  TROPONINI >20.00* >20.00*   Hepatic Function Panel  Recent Labs  01/20/14 2035  PROT 8.9*  ALBUMIN 4.4  AST 51*  ALT 22  ALKPHOS 69  BILITOT 0.5    Recent Labs  01/21/14 0500  CHOL 166  LDL 102  Telemetry: no VT Personally viewed.   EKG:  Lat ST elevation at 2252 on 3/27. Same on 3/28 - RBBB, LAFB.   Cardiac Studies:  Prox LAD DES. Distal embolization of thrombus to distal LAD. EF 45-50% distal anterolateral hypokinesis.   Marland Kitchen aspirin EC  81 mg Oral Daily  . atorvastatin  40 mg Oral q1800  . insulin aspart  0-9 Units  Subcutaneous TID WC  . losartan  25 mg Oral Daily  . metoprolol tartrate  25 mg Oral BID  . pantoprazole  40 mg Oral Daily  . Ticagrelor  90 mg Oral BID    Assessment/Plan:  Active Problems:   STEMI (ST elevation myocardial infarction)   Hyperlipidemia  1) Anterior MI - 18 hours of tirofiban with distal LAD embolization complete  - Brilinta, ASA, metop 25 BID, ARB.   - No arrhythmia  - Mild nausea - Zofran. Was nauseous all day yesterday. Improved. Benign abd exam. No melena.   - Feels great she says.   2) Hyperlipidemia  - Goal LDL 70. Statin  3) RBBB - chronic  4) History of VSD as child, no evidence on 2013 ECHO. Dr. Eden Emms.   Non smoker.   DC later today after lunch.  Discussed risks of arrhythmia, MI. No VT over last 24-48 hrs. Mild EF decrease 45-50%  Follow up in one week with Dr. Eden Emms or APP (TCM visit).   Melissa Nixon 01/22/2014, 10:35 AM

## 2014-01-22 NOTE — Discharge Summary (Signed)
Personally seen and examined, agree with above. Please see my progress note for full details.

## 2014-01-22 NOTE — Discharge Instructions (Signed)
Call [facility] street  [phone] if any bleeding, swelling or drainage at cath site.  May shower, no tub baths for 48 hours for groin sticks.   No lifting over 5 pounds until seen in office.  No driving and no work until seen in the office.  Take 1 NTG, under your tongue, while sitting.  If no relief of pain may repeat NTG, one tab every 5 minutes up to 3 tablets total over 15 minutes.  If no relief CALL 911.  If you have dizziness/lightheadness  while taking NTG, stop taking and call 911.        Do not stop Brilinta.  Heart Healthy diet and diabetic.  You need to see your primary physician for your borderline diabetes.  Very important to adjust diet.

## 2014-01-23 ENCOUNTER — Telehealth: Payer: Self-pay | Admitting: Family Medicine

## 2014-01-23 MED FILL — Lidocaine HCl Local Preservative Free (PF) Inj 1%: INTRAMUSCULAR | Qty: 30 | Status: AC

## 2014-01-23 MED FILL — Nitroglycerin IV Soln 200 MCG/ML in D5W: INTRAVENOUS | Qty: 1 | Status: AC

## 2014-01-23 MED FILL — Sodium Chloride IV Soln 0.9%: INTRAVENOUS | Qty: 50 | Status: AC

## 2014-01-23 MED FILL — Nitroglycerin IV Soln 200 MCG/ML in D5W: INTRAVENOUS | Qty: 250 | Status: AC

## 2014-01-23 MED FILL — Tirofiban HCl in NaCl IV Soln 25 MG/500ML (Base Equiv): INTRAVENOUS | Qty: 100 | Status: AC

## 2014-01-23 MED FILL — Fentanyl Citrate Inj 0.05 MG/ML: INTRAMUSCULAR | Qty: 2 | Status: AC

## 2014-01-23 MED FILL — Heparin Sodium (Porcine) 2 Unit/ML in Sodium Chloride 0.9%: INTRAMUSCULAR | Qty: 500 | Status: AC

## 2014-01-23 MED FILL — Bivalirudin Trifluoroacetate For IV Soln 250 MG (Base Equiv): INTRAVENOUS | Qty: 250 | Status: AC

## 2014-01-23 MED FILL — Midazolam HCl Inj 2 MG/2ML (Base Equivalent): INTRAMUSCULAR | Qty: 2 | Status: AC

## 2014-01-23 NOTE — Telephone Encounter (Signed)
Pt called to state she was admitted into the hospital on Friday 01/20/14 for a heart attack, she now has a stent in her main artery. She is scheduled for a CPX on 01/26/14, do you want me to change her visit to an OV or leave as a CPX??

## 2014-01-23 NOTE — Telephone Encounter (Signed)
Should change this to a hospital follow up as her cardiologist wanted [place] to discuss her blood sugar. Then can do the physical at another time. Thanks.

## 2014-01-24 NOTE — Telephone Encounter (Signed)
Done. I called to advise pt but she was still in bed, I spoke to her spouse.

## 2014-01-25 ENCOUNTER — Encounter: Payer: Self-pay | Admitting: Cardiovascular Disease

## 2014-01-25 ENCOUNTER — Ambulatory Visit (INDEPENDENT_AMBULATORY_CARE_PROVIDER_SITE_OTHER): Payer: Commercial Managed Care - HMO | Admitting: Nurse Practitioner

## 2014-01-25 ENCOUNTER — Encounter: Payer: Self-pay | Admitting: Nurse Practitioner

## 2014-01-25 ENCOUNTER — Telehealth: Payer: Self-pay | Admitting: Cardiovascular Disease

## 2014-01-25 VITALS — BP 110/80 | HR 85 | Ht 66.5 in | Wt 174.8 lb

## 2014-01-25 DIAGNOSIS — R0600 Dyspnea, unspecified: Secondary | ICD-10-CM

## 2014-01-25 DIAGNOSIS — R0609 Other forms of dyspnea: Secondary | ICD-10-CM

## 2014-01-25 DIAGNOSIS — R0989 Other specified symptoms and signs involving the circulatory and respiratory systems: Secondary | ICD-10-CM

## 2014-01-25 DIAGNOSIS — I2510 Atherosclerotic heart disease of native coronary artery without angina pectoris: Secondary | ICD-10-CM | POA: Diagnosis not present

## 2014-01-25 DIAGNOSIS — I2109 ST elevation (STEMI) myocardial infarction involving other coronary artery of anterior wall: Secondary | ICD-10-CM

## 2014-01-25 LAB — CBC
HCT: 41.1 % (ref 36.0–46.0)
Hemoglobin: 13.9 g/dL (ref 12.0–15.0)
MCHC: 33.9 g/dL (ref 30.0–36.0)
MCV: 89.1 fl (ref 78.0–100.0)
Platelets: 489 10*3/uL — ABNORMAL HIGH (ref 150.0–400.0)
RBC: 4.61 Mil/uL (ref 3.87–5.11)
RDW: 13.5 % (ref 11.5–14.6)
WBC: 11.9 10*3/uL — ABNORMAL HIGH (ref 4.5–10.5)

## 2014-01-25 LAB — BASIC METABOLIC PANEL
BUN: 15 mg/dL (ref 6–23)
CO2: 23 mEq/L (ref 19–32)
Calcium: 9.1 mg/dL (ref 8.4–10.5)
Chloride: 99 mEq/L (ref 96–112)
Creatinine, Ser: 1 mg/dL (ref 0.4–1.2)
GFR: 58.18 mL/min — ABNORMAL LOW (ref >60.00)
Glucose, Bld: 105 mg/dL — ABNORMAL HIGH (ref 70–99)
Potassium: 3 mEq/L — ABNORMAL LOW (ref 3.5–5.1)
Sodium: 134 mEq/L — ABNORMAL LOW (ref 135–145)

## 2014-01-25 LAB — BRAIN NATRIURETIC PEPTIDE: Pro B Natriuretic peptide (BNP): 436 pg/mL — ABNORMAL HIGH (ref 0.0–100.0)

## 2014-01-25 MED ORDER — CLOPIDOGREL BISULFATE 75 MG PO TABS: 75.0000 mg | ORAL_TABLET | Freq: Every day | ORAL | Status: DC

## 2014-01-25 NOTE — Telephone Encounter (Signed)
Patient called in with concern that she is SOB constantly since her discharge from hospital this past Friday (post Stemi/stent placement). States she can only walk about 10 feet and then she has to sit down. States she is unable to successfully go upstairs to bedroom without resting between steps. States she can only sleep if she is in sitting position and she gets dizzy when she first stands up. She does sound "breathy" while talking. Denies cough, swelling to extremities. States she does not have scales or BP cuff so she does not know current weight or BP reading. She does not routinely check her BS (diabetes), but states she is supposed to discuss this with her PCP at the next visit. UOP ok. Appetite ok. Denies chest pain/arm/shoulder pain. States she was told she should see Cardiologist within a couple of days after hospital discharge but her f/u appointment with Dr. Eden Emms is not until April 10th. She does have a PCP visit scheduled for tomorrow but wants to see the Cardiologist sooner than 4/10.  Reviewed with Scherrie Bateman, RN.  Patient will come in today to be seen by Norma Fredrickson, NP. Patient agreeable and will arrive at 1:45 pm with list of medications/films from hospitalization.

## 2014-01-25 NOTE — Telephone Encounter (Signed)
New Message:  Pt is requesting a sooner appt... Pt does not want to see a PA.Marland Kitchen Pt is c/o SOB.. States she is having it now... Pt is a pt of Dr. Eden Emms but is requesting Dr. Shirlee Latch... Pt wants to speak to a nurse.Marland KitchenMarland Kitchen

## 2014-01-25 NOTE — Progress Notes (Addendum)
[name] Date of Birth: 04/27/1946 Medical Record #[id number]  History of Present Illness: Ms. Melissa Nixon is seen back today for a work in visit. Seen for Dr. Eden Emms. She is a 68 year old female who has GERD, ongoing tobacco abuse, HLD, HTN, murmur, prior hx of VSD (however noted to not be seen on echo from 2013), chronic RBBB with LAFB, and DM.   Most recently presented with chest pain - STEMI last week with PCI/DES to the LAD. Received tirofiban for distal embolization. Has residual LCX disease. On Brilinta for at least one year.   Called earlier today with shortness of breath. Thus added to my schedule.   Comes in today. Here with her husband. Still smoking - not ready to stop. No chest pain. Has been home since Sunday. Since discharge she is having "spurts" of shortness of breath. Notes that she can't get her breath. Has to stop her activity and cannot walk very far. No cough. No fever or chills.  Having to sleep propped up for the last 2 nights which does help. Does not look like she got her echo prior to discharge. EF was 45 to 50% by cath. No swelling. No cough. Definitely not having chest pain. Sees Dr. Eden Emms next week for follow up. Also taking decongestants - says she can't live without them.  Current Outpatient Prescriptions  Medication Sig Dispense Refill  . alendronate (FOSAMAX) 10 MG tablet Take 1 tablet (10 mg total) by mouth daily before breakfast. Take with a full glass of water on an empty stomach.  90 tablet  3  . Ascorbic Acid (VITAMIN C PO) Take 1 tablet by mouth daily.       Marland Kitchen aspirin 81 MG tablet Take 81 mg by mouth daily.      Marland Kitchen atorvastatin (LIPITOR) 40 MG tablet Take 1 tablet (40 mg total) by mouth daily at 6 PM.  30 tablet  6  . Cholecalciferol (VITAMIN D PO) Take 1 tablet by mouth daily.       . Loratadine-Pseudoephedrine (CLARITIN-D 24 HOUR PO) Take 1 tablet by mouth daily.      Marland Kitchen losartan (COZAAR) 25 MG tablet Take 1 tablet (25 mg total) by mouth daily.  30  tablet  6  . metoprolol tartrate (LOPRESSOR) 25 MG tablet Take 1 tablet (25 mg total) by mouth 2 (two) times daily.  60 tablet  6  . nitroGLYCERIN (NITROSTAT) 0.4 MG SL tablet Place 1 tablet (0.4 mg total) under the tongue every 5 (five) minutes x 3 doses as needed for chest pain.  25 tablet  4  . pantoprazole (PROTONIX) 40 MG tablet Take 40 mg by mouth daily.      . Ticagrelor (BRILINTA) 90 MG TABS tablet Take 1 tablet (90 mg total) by mouth 2 (two) times daily.  60 tablet  0   No current facility-administered medications for this visit.    Allergies  Allergen Reactions  . Mushroom Extract Complex Other (See Comments)    Heart race  . Other Other (See Comments)    Olives:heart race  . Tetracyclines & Related Other (See Comments)    Yeast infections    Past Medical History  Diagnosis Date  . Cholecystitis   . HYPERTENSION   . Allergic rhinitis due to other allergen   . GERD   . ELECTROCARDIOGRAM, ABNORMAL   . LOW BACK PAIN 01/02/2010    Qualifier: Diagnosis of  By: Yetta Barre MD, Bernadene Bell.   . Osteoporosis, dexa 2014,  fosamax, cal, vit D and exercise advised 06/01/2013  . Murmur, hx VSD, evaluated by cardiology in 2013 06/03/2012  . CAD, multiple vessel, residual disease LCX 40% OM 01/22/2014  . STEMI (ST elevation myocardial infarction), 01/20/14, LAD PCI/Stent DES Promus preimer 01/20/2014  . Hyperlipidemia LDL goal < 70 01/22/2014  . RBBB, chronic 01/22/2014  . Tobacco use, stopped in 2013 01/22/2014  . LV dysfunction, EF at cath 45-50% 01/22/2014  . Hyperglycemia 01/22/2014    Past Surgical History  Procedure Laterality Date  . Laparoscopic cholecystectomy    . Abdominal hysterectomy  1983  . Coronary angioplasty with stent placement  01/20/2014    Promus preimer to LAD with STEMI    History  Smoking status  . Current Every Day Smoker -- 1.00 packs/day  . Types: Cigarettes  Smokeless tobacco  . Not on file    History  Alcohol Use No    Family History  Problem Relation Age  of Onset  . Diabetes    . Hypertension      Review of Systems: The review of systems is per the HPI.  All other systems were reviewed and are negative.  Physical Exam: BP 110/80  Pulse 85  Ht 5' 6.5" (1.689 m)  Wt 174 lb 12.8 oz (79.289 kg)  BMI 27.79 kg/m2  SpO2 100% Patient is alert and in no acute distress but I did witness a spell of marked dyspnea during our visit with increased respiratory rate - lasted for about 30 seconds then stopped. Smells greatly of tobacco. Color sallow. Skin is warm and dry. Color is normal.  HEENT is unremarkable. Normocephalic/atraumatic. PERRL. Sclera are nonicteric. Neck is supple. No masses. No JVD. Lungs are fairly clear. Cardiac exam shows a regular rate and rhythm. No murmur that I could appreciate. Abdomen is soft. Extremities are without edema. Gait and ROM are intact. No gross neurologic deficits noted.  Wt Readings from Last 3 Encounters:  01/25/14 174 lb 12.8 oz (79.289 kg)  01/21/14 176 lb 9.4 oz (80.1 kg)  01/21/14 176 lb 9.4 oz (80.1 kg)     LABORATORY DATA: PENDING  EKG shows sinus, bifascicular block, evolving anterior changes.   Lab Results  Component Value Date   WBC 14.1* 01/21/2014   HGB 13.3 01/21/2014   HCT 36.9 01/21/2014   PLT 299 01/21/2014   GLUCOSE 155* 01/21/2014   CHOL 166 01/21/2014   TRIG 117 01/21/2014   HDL 41 01/21/2014   LDLDIRECT 138.4 07/16/2012   LDLCALC 102* 01/21/2014   ALT 22 01/20/2014   AST 51* 01/20/2014   NA 134* 01/21/2014   K 3.8 01/21/2014   CL 98 01/21/2014   CREATININE 0.58 01/21/2014   BUN 10 01/21/2014   CO2 21 01/21/2014   TSH 2.15 01/02/2010   INR 0.93 01/20/2014   HGBA1C 6.1* 01/21/2014   ANGIOGRAPHY:  1. Left main: Angiographically normal and trifurcated into a LAD, ramus intermediate vessel, and left circumflex vessel.  2. LAD: Totally occluded just beyond the origin with thrombus and TIMI 0 flow  3. Ramus Intermediate: Angiographically normal  4. Left circumflex: Moderate size vessel with a  40% stenosis in an inferior branch of the first marginal vessel and 30% distal circumflex stenosis  5. Right coronary artery: Angiographically normal it gave rise to the PDA. There were no collaterals to the LAD.  Left ventriculography revealed the dysfunction with mid to distal anterolateral hypocontractility and ejection fraction of 45-50%.  Following percutaneous coronary intervention to the LAD with PTCA,  thrombectomy with significant clot retrieval, and stenting with a 3.5x20 mm Promus Premier DES stent postdilated to 3.66 mm, the 100% occlusion was reduced to 0% there was resumption of brisk TIMI 3 flow. There was still evidence for distal embolization of thrombus at the apex.   IMPRESSION:  Acute anterior wall myocardial infarction secondary to total proximal LAD occlusion.  Normal ramus intermediate vessel, left circumflex coronary artery with 40% OM stenosis and 30% distal stenosis, and normal RCA.  Mild acute LV dysfunction with an ejection fraction of 45-50% and mild to moderate mid distal anterolateral hypocontractility.  Successful percutaneous coronary intervention of the LAD proximally with PTCA, thrombectomy, DES stenting with a 3.5x20 mm Promus Premier DES stent postdilated 3.66 mm with resumption of TIMI 3 flow in the 100% occlusion reduced to 0%. However, there is still evidence for distal embolization of thrombus to the apical LAD for which tirofiban was started.  Angiomax/180 mg Brilinta/ICand IV nitroglycerin/tirofiban  Lennette Bihari, MD, [facility]  01/20/2014  10:50 PM   Lab Results  Component Value Date   TROPONINI >20.00* 01/21/2014     Assessment / Plan: 1. Dyspnea - post MI - she is on Brilinta - we have seen dyspnea with Brilinta (mechanism unknown). Also with some LV dysfunction by cath noted. Prior VSD but not seen on last echo from 2013. No murmur or rub that I can appreciate. Have discussed her case/findings with Dr. Eden Emms here in the office - will stop the  Brilinta, change to Plavix 75 mg a day, check labs to include BNP, BMET and CBC. Arrange for echo this week (on schedule for here tomorrow). Needs to also stop smoking - but she is not ready.   2. Recent anterior STEMI with DES to the LAD - committed to DAPT for at least one year. Switching to Plavix. Dr. Eden Emms has advised P2Y12 testing in about 4 weeks - will arrange that lab on her return visit. Also advised to not use decongestants.   3. Ongoing tobacco abuse - cessation encouraged.   Patient is agreeable to this plan and will call if any problems develop in the interim.   Rosalio Macadamia, RN, ANP-C [facility] [address] [place], [place]  [address] [phone]   Addendum: 01/26/2014 Patient is here today for her echo. She wanted to let us know that she is "dramatically" better. No longer short of breath. Will proceed with the echo as planned. Continue Plavix. Follow up here next week as planned.

## 2014-01-25 NOTE — Patient Instructions (Addendum)
Stop smoking  Stop the Brilinta - start Plavix 75 mg a day - you have to take this every day  We will need to check a blood test for the Plavix in approximately 4 weeks - will schedule this on your return visit next week  We will check lab today  We will get an echocardiogram of your heart   Restrict your salt  See Dr. Eden Emms back next week as planned  If your symptoms get worse, call or go to the ER.  Call the [facility] office at [phone] if you have any questions, problems or concerns.

## 2014-01-26 ENCOUNTER — Other Ambulatory Visit: Payer: Self-pay | Admitting: *Deleted

## 2014-01-26 ENCOUNTER — Encounter: Payer: Self-pay | Admitting: Family Medicine

## 2014-01-26 ENCOUNTER — Ambulatory Visit (HOSPITAL_COMMUNITY): Payer: Medicare HMO | Attending: Cardiovascular Disease | Admitting: Radiology

## 2014-01-26 ENCOUNTER — Other Ambulatory Visit: Payer: Self-pay

## 2014-01-26 ENCOUNTER — Telehealth: Payer: Self-pay | Admitting: *Deleted

## 2014-01-26 ENCOUNTER — Ambulatory Visit (INDEPENDENT_AMBULATORY_CARE_PROVIDER_SITE_OTHER): Payer: Medicare HMO | Admitting: Family Medicine

## 2014-01-26 VITALS — BP 134/84 | HR 94 | Temp 97.7°F | Wt 175.0 lb

## 2014-01-26 DIAGNOSIS — E785 Hyperlipidemia, unspecified: Secondary | ICD-10-CM

## 2014-01-26 DIAGNOSIS — M810 Age-related osteoporosis without current pathological fracture: Secondary | ICD-10-CM

## 2014-01-26 DIAGNOSIS — I2510 Atherosclerotic heart disease of native coronary artery without angina pectoris: Secondary | ICD-10-CM | POA: Insufficient documentation

## 2014-01-26 DIAGNOSIS — I519 Heart disease, unspecified: Secondary | ICD-10-CM

## 2014-01-26 DIAGNOSIS — J3089 Other allergic rhinitis: Secondary | ICD-10-CM

## 2014-01-26 DIAGNOSIS — I10 Essential (primary) hypertension: Secondary | ICD-10-CM

## 2014-01-26 DIAGNOSIS — I213 ST elevation (STEMI) myocardial infarction of unspecified site: Secondary | ICD-10-CM

## 2014-01-26 DIAGNOSIS — I219 Acute myocardial infarction, unspecified: Secondary | ICD-10-CM

## 2014-01-26 DIAGNOSIS — R011 Cardiac murmur, unspecified: Secondary | ICD-10-CM

## 2014-01-26 DIAGNOSIS — F17200 Nicotine dependence, unspecified, uncomplicated: Secondary | ICD-10-CM

## 2014-01-26 DIAGNOSIS — Z720 Tobacco use: Secondary | ICD-10-CM

## 2014-01-26 MED ORDER — POTASSIUM CHLORIDE CRYS ER 20 MEQ PO TBCR: 20.0000 meq | EXTENDED_RELEASE_TABLET | Freq: Two times a day (BID) | ORAL | Status: DC

## 2014-01-26 MED ORDER — FUROSEMIDE 20 MG PO TABS: 20.0000 mg | ORAL_TABLET | Freq: Every day | ORAL | Status: DC

## 2014-01-26 NOTE — Progress Notes (Signed)
Pre visit review using our clinic review tool, if applicable. No additional management support is needed unless otherwise documented below in the visit note. 

## 2014-01-26 NOTE — Telephone Encounter (Signed)
PA through covermymeds for [employer], medication is plavix

## 2014-01-26 NOTE — Progress Notes (Signed)
Echocardiogram performed.  

## 2014-01-26 NOTE — Patient Instructions (Addendum)
We recommend the following healthy lifestyle measures: - eat a healthy low carb diet consisting of lots of vegetables, fruits, beans, nuts, seeds, healthy meats such as white chicken and fish and whole grains.  - avoid fried foods, fast food, processed foods, sodas, red meet and other fattening foods.  - get a least 150-300 minutes of aerobic exercise per week - AFTER OK given from CARDIOLOGIST  CALL QUIT line for quit coach and quit smoking  Let us know if you decide you want to see the pulmonologist about lung cancer screening  Follow up with your cardiologist as scheduled  Follow up in: 3-4 months with me for you annual medicare exam

## 2014-01-26 NOTE — Progress Notes (Signed)
Chief Complaint  Patient presents with  . post hosptialization    HPI:  Hospital f/u: -3/27-3/29/15 for STEMI/CAD/CP with DES to LAD 2/27 -followed by Dr. Eden EmmsNishan -saw cards yesterday for some dyspnea since discharge - per notes brilinta stopped, plavix started, echo scheduled and labs checked -on asa, plavix, statin, BB, arb for CAD, HTN, HLD -she reports since stopping brilinta already feeling much better -denies: CP, palpations, swelling -she is going to start cardiac rehab or silver sneakers  Other issues:  GERD: -taking pantoprazole for this, reports stable  Tobacco Use: -2 packs per week -had not had lung cancer screening  - not interested now -did not like chantic  Allergic Rhinitis: -stable  Prediabetes: -have advised healthy low carb diet and exercise  ROS: See pertinent positives and negatives per HPI.  Past Medical History  Diagnosis Date  . Cholecystitis   . HYPERTENSION   . Allergic rhinitis due to other allergen   . GERD   . ELECTROCARDIOGRAM, ABNORMAL   . LOW BACK PAIN 01/02/2010    Qualifier: Diagnosis of  By: Yetta BarreJones MD, Bernadene Bellhomas L.   . Osteoporosis, dexa 2014, fosamax, cal, vit D and exercise advised 06/01/2013  . Murmur, hx VSD, evaluated by cardiology in 2013 06/03/2012  . CAD, multiple vessel, residual disease LCX 40% OM 01/22/2014  . STEMI (ST elevation myocardial infarction), 01/20/14, LAD PCI/Stent DES Promus preimer 01/20/2014  . Hyperlipidemia LDL goal < 70 01/22/2014  . RBBB, chronic 01/22/2014  . Tobacco use, stopped in 2013 01/22/2014  . LV dysfunction, EF at cath 45-50% 01/22/2014  . Hyperglycemia 01/22/2014    Past Surgical History  Procedure Laterality Date  . Laparoscopic cholecystectomy    . Abdominal hysterectomy  1983  . Coronary angioplasty with stent placement  01/20/2014    Promus preimer to LAD with STEMI    Family History  Problem Relation Age of Onset  . Diabetes    . Hypertension      History   Social History  . Marital  Status: Married    Spouse Name: N/A    Number of Children: N/A  . Years of Education: N/A   Social History Main Topics  . Smoking status: Current Every Day Smoker -- 1.00 packs/day    Types: Cigarettes  . Smokeless tobacco: None  . Alcohol Use: No  . Drug Use: No  . Sexual Activity: None   Other Topics Concern  . None   Social History Narrative  . None    Current outpatient prescriptions:alendronate (FOSAMAX) 10 MG tablet, Take 1 tablet (10 mg total) by mouth daily before breakfast. Take with a full glass of water on an empty stomach., Disp: 90 tablet, Rfl: 3;  Ascorbic Acid (VITAMIN C PO), Take 1 tablet by mouth daily. , Disp: , Rfl: ;  aspirin 81 MG tablet, Take 81 mg by mouth daily., Disp: , Rfl:  atorvastatin (LIPITOR) 40 MG tablet, Take 1 tablet (40 mg total) by mouth daily at 6 PM., Disp: 30 tablet, Rfl: 6;  Cholecalciferol (VITAMIN D PO), Take 1 tablet by mouth daily. , Disp: , Rfl: ;  clopidogrel (PLAVIX) 75 MG tablet, Take 1 tablet (75 mg total) by mouth daily., Disp: 30 tablet, Rfl: 11;  Loratadine-Pseudoephedrine (CLARITIN-D 24 HOUR PO), Take 1 tablet by mouth daily., Disp: , Rfl:  losartan (COZAAR) 25 MG tablet, Take 1 tablet (25 mg total) by mouth daily., Disp: 30 tablet, Rfl: 6;  metoprolol tartrate (LOPRESSOR) 25 MG tablet, Take 1 tablet (25 mg total)  by mouth 2 (two) times daily., Disp: 60 tablet, Rfl: 6;  nitroGLYCERIN (NITROSTAT) 0.4 MG SL tablet, Place 1 tablet (0.4 mg total) under the tongue every 5 (five) minutes x 3 doses as needed for chest pain., Disp: 25 tablet, Rfl: 4 pantoprazole (PROTONIX) 40 MG tablet, Take 40 mg by mouth daily., Disp: , Rfl:   EXAM:  Filed Vitals:   01/26/14 0928  BP: 134/84  Pulse: 94  Temp: 97.7 F (36.5 C)    Body mass index is 27.83 kg/(m^2).  GENERAL: vitals reviewed and listed above, alert, oriented, appears well hydrated and in no acute distress  HEENT: atraumatic, conjunttiva clear, no obvious abnormalities on inspection of  external nose and ears  NECK: no obvious masses on inspection  LUNGS: clear to auscultation bilaterally, no wheezes, rales or rhonchi, good air movement  CV: HRRR, no peripheral edema  MS: moves all extremities without noticeable abnormality  PSYCH: pleasant and cooperative, no obvious depression or anxiety  ASSESSMENT AND PLAN:  Discussed the following assessment and plan:  HYPERTENSION  Allergic rhinitis due to other allergen  Murmur, hx VSD, evaluated by cardiology in 2013  Osteoporosis, dexa 2014, fosamax, cal, vit D and exercise advised  STEMI (ST elevation myocardial infarction), 01/20/14, LAD PCI/Stent DES Promus preimer, tirofiban given for distal embolization of thrombus  CAD, multiple vessel, residual disease LCX 40% OM  Hyperlipidemia LDL goal < 70  Tobacco use, stopped in 2013  LV dysfunction, EF at cath 45-50%  -smoking cessation counseling >3 minutes; stressed importance of cessation and avoidance of 2nd hand smoke -diet and exercise advised with ok form cardiologist - she may do cardiac rehab program -Patient advised to return or notify a doctor immediately if symptoms worsen or persist or new  concerns arise. -discussed recommended HM measures - she declined all vaccines today and colon cancer screening for now bu twill consider cologuard -discussed lung cancer screening - she declined  -follow up for medicare wellness exam in 4 months   Patient Instructions  We recommend the following healthy lifestyle measures: - eat a healthy low carb diet consisting of lots of vegetables, fruits, beans, nuts, seeds, healthy meats such as white chicken and fish and whole grains.  - avoid fried foods, fast food, processed foods, sodas, red meet and other fattening foods.  - get a least 150-300 minutes of aerobic exercise per week - AFTER OK given from CARDIOLOGIST  CALL QUIT line for quit coach and quit smoking  Let us know if you decide you want to see the  pulmonologist about lung cancer screening  Follow up with your cardiologist as scheduled  Follow up in: 3-4 months with me for you annual medicare exam       Fordyce Lepak R.

## 2014-01-26 NOTE — Telephone Encounter (Signed)
Opened in error

## 2014-01-27 LAB — POCT ACTIVATED CLOTTING TIME: Activated Clotting Time: 808 seconds

## 2014-02-03 ENCOUNTER — Encounter: Payer: Medicare HMO | Admitting: Cardiovascular Disease

## 2014-02-03 ENCOUNTER — Telehealth: Payer: Self-pay | Admitting: *Deleted

## 2014-02-03 ENCOUNTER — Ambulatory Visit (INDEPENDENT_AMBULATORY_CARE_PROVIDER_SITE_OTHER): Payer: Medicare HMO | Admitting: Cardiovascular Disease

## 2014-02-03 ENCOUNTER — Encounter: Payer: Self-pay | Admitting: Cardiovascular Disease

## 2014-02-03 VITALS — BP 119/65 | HR 87 | Ht 66.5 in | Wt 172.2 lb

## 2014-02-03 DIAGNOSIS — I2589 Other forms of chronic ischemic heart disease: Secondary | ICD-10-CM

## 2014-02-03 DIAGNOSIS — I219 Acute myocardial infarction, unspecified: Secondary | ICD-10-CM

## 2014-02-03 DIAGNOSIS — I255 Ischemic cardiomyopathy: Secondary | ICD-10-CM

## 2014-02-03 DIAGNOSIS — I4510 Unspecified right bundle-branch block: Secondary | ICD-10-CM

## 2014-02-03 DIAGNOSIS — I213 ST elevation (STEMI) myocardial infarction of unspecified site: Secondary | ICD-10-CM

## 2014-02-03 MED ORDER — PRASUGREL HCL 10 MG PO TABS: 10.0000 mg | ORAL_TABLET | Freq: Every day | ORAL | Status: DC

## 2014-02-03 NOTE — Assessment & Plan Note (Signed)
Well controlled.  Continue current medications and low sodium Dash type diet.    

## 2014-02-03 NOTE — Telephone Encounter (Signed)
LEFT  VOICE  MAIL  THAT   DR Eden Emms   CHANGED  PLAVIX  TO EFFIENT  10 MG   1  EVERY DAY  AND  TO  DISREGARD  NEEDING   THE  P2Y (LAB) IN   3 WEEKS  EFFIENT PHONED  IN  TO   [facility]  .Zack Seal

## 2014-02-03 NOTE — Progress Notes (Signed)
Patient ID: Melissa Nixon, female   DOB: 1946/02/20, 68 y.o.   MRN: [id number] 68 y.o. female w/ PMHx significant for hypertension, GERD and tobacco abuse I had not seen her for 2 years   She  presented to [facility] on 01/20/2014 with complaints of chest pain. Reports that she had an episode 3/26 (thought it was GERD) and then again 3/27 at 5:30 pm that has been constant since then. Associated with diaphoresis. Radiates to both arms and associated with shortness of breath. EKG revealed NSR with RBBB, ST elevations in V1, V2, V3 and reciprocal changes inferiorly. CXray not done. Labs are significant for troponin of 6, wbc of 19. Received heparin and aspirin in the ER.  Taken emergently to cath lab:  Acute anterior wall myocardial infarction secondary to total proximal LAD occlusion.  Normal ramus intermediate vessel, left circumflex coronary artery with 40% OM stenosis and 30% distal stenosis, and normal RCA. Mild acute LV dysfunction with an ejection fraction of 45-50% and mild to moderate mid distal anterolateral hypocontractility.  Successful percutaneous coronary intervention of the LAD proximally with PTCA, thrombectomy, DES stenting with a 3.5x20 mm Promus Premier DES stent postdilated 3.66 mm with resumption of TIMI 3 flow in the 100% occlusion reduced to 0%. However, there is still evidence for distal embolization of thrombus to the apical LAD for which tirofiban was started.  1. STEMI (ST elevation myocardial infarction), 01/20/14, LAD PCI/Stent DES Promus preimer, tirofiban given for distal embolization of thrombus, continue asa and Brilinta, 30 day free card given. Pk troponin > 20.  2. CAD, multiple vessel, residual disease LCX 40% OM continue medical therapy   Echo 4/2 with EF 35-40%  She has a history of restrictive VSD when younger but echo and LV gram showed none.   Had dyspnea with Brillinta and needs to be changed to Plavix.  Will need P2Y testing   Discussed smoking cessation  with her Also discussed need for MRI in July to assess need for AICD    ROS: Denies fever, malais, weight loss, blurry vision, decreased visual acuity, cough, sputum, SOB, hemoptysis, pleuritic pain, palpitaitons, heartburn, abdominal pain, melena, lower extremity edema, claudication, or rash.  All other systems reviewed and negative  General: Affect appropriate Healthy:  appears stated age HEENT: normal Neck supple with no adenopathy JVP normal no bruits no thyromegaly Lungs clear with no wheezing and good diaphragmatic motion Heart:  S1/S2 no murmur, no rub, gallop or click PMI normal Abdomen: benighn, BS positve, no tenderness, no AAA no bruit.  No HSM or HJR Distal pulses intact with no bruits No edema Neuro non-focal Skin warm and dry No muscular weakness   Current Outpatient Prescriptions  Medication Sig Dispense Refill  . Loratadine-Pseudoephedrine (CLARITIN-D 12 HOUR PO) Take by mouth daily.      Marland Kitchen alendronate (FOSAMAX) 10 MG tablet Take 1 tablet (10 mg total) by mouth daily before breakfast. Take with a full glass of water on an empty stomach.  90 tablet  3  . Ascorbic Acid (VITAMIN C PO) Take 1 tablet by mouth daily.       Marland Kitchen aspirin 81 MG tablet Take 81 mg by mouth daily.      Marland Kitchen atorvastatin (LIPITOR) 40 MG tablet Take 1 tablet (40 mg total) by mouth daily at 6 PM.  30 tablet  6  . Cholecalciferol (VITAMIN D PO) Take 1 tablet by mouth daily.       . clopidogrel (PLAVIX) 75 MG tablet Take  1 tablet (75 mg total) by mouth daily.  30 tablet  11  . furosemide (LASIX) 20 MG tablet Take 1 tablet (20 mg total) by mouth daily.  30 tablet  6  . Loratadine-Pseudoephedrine (CLARITIN-D 24 HOUR PO) Take 1 tablet by mouth daily.      Marland Kitchen. losartan (COZAAR) 25 MG tablet Take 1 tablet (25 mg total) by mouth daily.  30 tablet  6  . metoprolol tartrate (LOPRESSOR) 25 MG tablet Take 1 tablet (25 mg total) by mouth 2 (two) times daily.  60 tablet  6  . nitroGLYCERIN (NITROSTAT) 0.4 MG SL tablet  Place 1 tablet (0.4 mg total) under the tongue every 5 (five) minutes x 3 doses as needed for chest pain.  25 tablet  4  . pantoprazole (PROTONIX) 40 MG tablet Take 40 mg by mouth daily.      . potassium chloride SA (K-DUR,KLOR-CON) 20 MEQ tablet Take 1 tablet (20 mEq total) by mouth 2 (two) times daily.  60 tablet  6   No current facility-administered medications for this visit.    Allergies  Mushroom extract complex; Other; and Tetracyclines & related  Electrocardiogram:  SR RBBB LAFB  Anterior MI  Assessment and Plan

## 2014-02-03 NOTE — Assessment & Plan Note (Signed)
Discussed importance of stopping and offerred Chantix but she prefers cold [place]  Since d/c has cut back from a ppd to 1/2 ppd

## 2014-02-03 NOTE — Assessment & Plan Note (Signed)
Bifasicular block  No high grade AV block during MI  F/u ECG next visit

## 2014-02-03 NOTE — Assessment & Plan Note (Signed)
She requires chronic reflux medication  Thought her MI was GERD  On protonix  Plavix will not be ideal substitution for Brillinta since she is likely to have high P2Y  Favor adding Effient

## 2014-02-03 NOTE — Assessment & Plan Note (Signed)
EF 45-50% by acute LV gram  But 35-40% by echo  She will have large scar burden due to late presentation and distal embolus  Q waves in precordium  MRI in July 3 months post revascularization to assess need for AICD

## 2014-02-03 NOTE — Patient Instructions (Addendum)
Your physician wants you to follow-up in: DUE  July WITH  DR Eden Emms  AFTER  MRI  DONE    You will receive a reminder letter in the mail two months in advance. If you don't receive a letter, please call our office to schedule the follow-up appointment. Your physician recommends that you continue on your current medications as directed. Please refer to the Current Medication list given to you today. EFFIENT  10 MG   EVERY DAY   Your physician has requested that you have a cardiac MRI. Cardiac MRI uses a computer to create images of your heart as its beating, producing both still and moving pictures of your heart and major blood vessels. For further information please visit [email or web address]. Please follow the instruction sheet given to you today for more information.CARDIOMYOPATHY  HX VSD  ASSESS IF NEEDS  AICD

## 2014-02-03 NOTE — Assessment & Plan Note (Addendum)
No angina  She presented late and had distal embolization.  Thought her pain was reflux.  Brillinta stopped due to dyspnea.  Should not be on plavix due to chronic need of proton pump inhibitor and likely poor PRU inhibition  Start Effient  Script called in .  F/U MRI in July and refer for AICD if EF 35% or less She is carrying nitro with her  She does not want to pay copay for cardiac rehab at [facility] [employer]neakers and [facility] has free program for her

## 2014-02-07 ENCOUNTER — Telehealth: Payer: Self-pay | Admitting: Cardiovascular Disease

## 2014-02-07 NOTE — Telephone Encounter (Signed)
New message   Patient calling C/O low blood pressure this am 105/68, yesterday 110/64, no chest pain. Some sob.   4/12 111/65

## 2014-02-07 NOTE — Telephone Encounter (Signed)
Pt states she is dizzy daily, pulse 78-81, bp as stated below. meds reviewed, she is taking losartan 25 mg daily and metoprolol 25 mg bid. Chart reviewed by DOD DR Clifton James, pt to hold losartan  to see if she feels better, she is to call with any worsening symptoms. Pt informed/ verbalized understanding. Medication was removed from the med list.

## 2014-02-09 ENCOUNTER — Telehealth: Payer: Self-pay | Admitting: *Deleted

## 2014-02-09 NOTE — Telephone Encounter (Signed)
PA to [facility] for EFFIENT.

## 2014-02-17 ENCOUNTER — Telehealth: Payer: Self-pay | Admitting: Cardiovascular Disease

## 2014-02-17 NOTE — Telephone Encounter (Signed)
New problem   Pt medication that was prescribe to pt recent lately she hasn't received it. She is still taking Plavix and want to continue to take it b/c of cost.

## 2014-02-21 ENCOUNTER — Telehealth: Payer: Self-pay | Admitting: Cardiovascular Disease

## 2014-02-21 ENCOUNTER — Telehealth: Payer: Self-pay | Admitting: Family Medicine

## 2014-02-21 DIAGNOSIS — M810 Age-related osteoporosis without current pathological fracture: Secondary | ICD-10-CM

## 2014-02-21 NOTE — Telephone Encounter (Signed)
[employer] updated the outcome for this PA: Favorable Patient: Melissa Nixon Drug: Effient 10MG  tablets Request Key: [facility] PA created on: 02/08/2014  Message sent to [facility]

## 2014-02-21 NOTE — Telephone Encounter (Signed)
Pt needs new rxs sent to [facility] alnedronate 10 mg #90 with refills and pantoprazole 40 mg #90 with refills

## 2014-02-21 NOTE — Telephone Encounter (Signed)
New message     Have one more question to ask

## 2014-02-21 NOTE — Telephone Encounter (Signed)
[employer] approved effient through 05/23/2014

## 2014-02-21 NOTE — Telephone Encounter (Signed)
PT  AWARE  PRIOR   AUTH  DONE  AND MED  APPROVED   PT  TO  CALL  AND   CHECK ON PRICE   IF  STILL NOT  AFFORDABLE WILL CALL  BACK .Zack Seal

## 2014-02-22 MED ORDER — ALENDRONATE SODIUM 10 MG PO TABS: 10.0000 mg | ORAL_TABLET | Freq: Every day | ORAL | Status: DC

## 2014-02-22 MED ORDER — PANTOPRAZOLE SODIUM 40 MG PO TBEC: 40.0000 mg | DELAYED_RELEASE_TABLET | Freq: Every day | ORAL | Status: DC

## 2014-02-22 NOTE — Telephone Encounter (Signed)
SPOKE  WITH  PT  SEVERAL TIMES  RE   EFFIENT   A PRIOR AUTH  WAS  RECEIVED  AND PT  STATES  MED  WILL  STILL COST   $125.00  FOR   3 MONTHS  SUPPLY AND  SHE  CANNOT  AFFORD  THAT   PT  MAY NOT  BE A GOOD  PLAVIX  CANDIDATE   BASED  ON LAST  OFFICE  VISIT   DICTATION  DUE  TO   GERD  WILL FORWARD  TO  DR Eden Emms  FOR  RECOMMENDATIONS./CY

## 2014-02-22 NOTE — Telephone Encounter (Signed)
PT  NOTIFIED . LAB ORDER  FAXED  TO  HOSPITAL/CY

## 2014-02-22 NOTE — Telephone Encounter (Signed)
rx for alendronate 10 mg #90 3 rf, and pantoprazole 40 mg #90 3 rf sent to [facility]

## 2014-02-22 NOTE — Telephone Encounter (Signed)
Have her get P2Y test at hospital to see if plavix is working  If test ok will keep on plavix if not will have to change

## 2014-02-23 ENCOUNTER — Encounter: Payer: Self-pay | Admitting: Cardiovascular Disease

## 2014-02-24 ENCOUNTER — Ambulatory Visit (HOSPITAL_COMMUNITY)
Admission: AD | Admit: 2014-02-24 | Discharge: 2014-02-24 | Disposition: A | Discharge status: Home / Self Care | Payer: Medicare HMO | Source: Ambulatory Visit | Attending: Cardiovascular Disease | Admitting: Cardiovascular Disease

## 2014-02-24 DIAGNOSIS — I2510 Atherosclerotic heart disease of native coronary artery without angina pectoris: Secondary | ICD-10-CM | POA: Diagnosis not present

## 2014-02-24 DIAGNOSIS — Z0189 Encounter for other specified special examinations: Secondary | ICD-10-CM | POA: Diagnosis present

## 2014-02-24 DIAGNOSIS — I252 Old myocardial infarction: Secondary | ICD-10-CM | POA: Diagnosis not present

## 2014-02-24 LAB — PLATELET INHIBITION P2Y12: Platelet Function  P2Y12: 68 [PRU] — ABNORMAL LOW (ref 194–418)

## 2014-03-03 ENCOUNTER — Telehealth: Payer: Self-pay | Admitting: *Deleted

## 2014-03-03 NOTE — Telephone Encounter (Signed)
[facility] Detail >>      Wendall Stade, MD      Sent: Thu Mar 02, 2014  5:20 PM      To: Alois Cliche, LPN              Message      P2Y was quite low so ok to keep on plavix                 ALAZAE SAWHNEY  02/21/2014   Telephone  MRN:  [id number]   Description: 68 year old female  Provider: Wendall Stade, MD  Department: [facility] Office            Call Documentation      Alois Cliche, LPN at 6/57/8469  3:02 PM      Status: Signed            PT  NOTIFIED . LAB ORDER  FAXED  TO  HOSPITAL/CY         Wendall Stade, MD at 02/22/2014  8:55 AM      Status: Signed            Have her get P2Y test at hospital to see if plavix is working  If test ok will keep on plavix if not will have to change         Alois Cliche, LPN at 04/25/5283  8:28 AM      Status: Signed            SPOKE  WITH  PT  SEVERAL TIMES  RE   EFFIENT   A PRIOR AUTH  WAS  RECEIVED  AND PT  STATES  MED  WILL  STILL COST   $125.00  FOR   3 MONTHS  SUPPLY AND  SHE  CANNOT  AFFORD  THAT   PT  MAY NOT  BE A GOOD  PLAVIX  CANDIDATE   BASED  ON LAST  OFFICE  VISIT   DICTATION  DUE  TO   GERD  WILL FORWARD  TO  DR Eden Emms  FOR  RECOMMENDATIONS./CY         Cecil Cranker Price at 02/21/2014  4:46 PM      Status: Signed            New message         Have one more question to ask                             Visit Pharmacy      [facility] - [place], [place] - [address]            Contacts        Type Contact Phone      02/21/2014  4:46 PM Phone (Incoming) Nilaya, Lundell (Self) [phone] (H)              Routing History      Priority Sent On From To Message Type        02/22/2014  8:55 AM Wendall Stade, MD Alois Cliche, LPN Patient Calls        02/22/2014  8:31 AM Alois Cliche, LPN Wendall Stade, MD Patient Calls        02/21/2014  4:49 PM Rica Records, LPN Patient Calls  Created by      Silas Sacramentoonna  M Price on 02/21/2014 04:46 PM         PT'S HUSBAND  AWARE  THE PT   CAN CONTINUE  TO TAKE PLAVIX./CY

## 2014-03-22 ENCOUNTER — Telehealth: Payer: Self-pay | Admitting: Cardiovascular Disease

## 2014-03-22 MED ORDER — CLOPIDOGREL BISULFATE 75 MG PO TABS: 75.0000 mg | ORAL_TABLET | Freq: Every day | ORAL | Status: DC

## 2014-03-22 NOTE — Telephone Encounter (Signed)
Follow up     Called to talk to a nurse last week---never received a call.  Need a referral to cardiac rehab. Pt has been taking husband's plavix and need her own presc because she cannot take brilinta.  New plavix presc [facility] to [facility]. Pt still want to talk to a nurse

## 2014-03-22 NOTE — Telephone Encounter (Signed)
PLAVIX  SCRIPT  SENT VIA  EPIC TO  [facility]  AND   CARDI[facility]  REHAB   FORM  FAXED  TODAY  PT  AWARE .Zack Seal

## 2014-03-23 ENCOUNTER — Other Ambulatory Visit: Payer: Self-pay | Admitting: *Deleted

## 2014-03-23 ENCOUNTER — Other Ambulatory Visit: Payer: Self-pay

## 2014-03-23 MED ORDER — POTASSIUM CHLORIDE CRYS ER 20 MEQ PO TBCR: 20.0000 meq | EXTENDED_RELEASE_TABLET | Freq: Two times a day (BID) | ORAL | Status: DC

## 2014-03-23 MED ORDER — ATORVASTATIN CALCIUM 40 MG PO TABS: 40.0000 mg | ORAL_TABLET | Freq: Every day | ORAL | Status: DC

## 2014-03-23 MED ORDER — FUROSEMIDE 20 MG PO TABS: 20.0000 mg | ORAL_TABLET | Freq: Every day | ORAL | Status: DC

## 2014-03-23 MED ORDER — METOPROLOL TARTRATE 25 MG PO TABS: 25.0000 mg | ORAL_TABLET | Freq: Two times a day (BID) | ORAL | Status: DC

## 2014-04-06 ENCOUNTER — Encounter (HOSPITAL_COMMUNITY)
Admission: RE | Admit: 2014-04-06 | Discharge: 2014-04-06 | Disposition: A | Discharge status: Home / Self Care | Payer: Medicare HMO | Source: Ambulatory Visit | Attending: Cardiovascular Disease | Admitting: Cardiovascular Disease

## 2014-04-06 DIAGNOSIS — I4510 Unspecified right bundle-branch block: Secondary | ICD-10-CM | POA: Insufficient documentation

## 2014-04-06 DIAGNOSIS — Z5189 Encounter for other specified aftercare: Secondary | ICD-10-CM | POA: Insufficient documentation

## 2014-04-06 DIAGNOSIS — I219 Acute myocardial infarction, unspecified: Secondary | ICD-10-CM | POA: Insufficient documentation

## 2014-04-06 DIAGNOSIS — R011 Cardiac murmur, unspecified: Secondary | ICD-10-CM | POA: Insufficient documentation

## 2014-04-06 DIAGNOSIS — I2510 Atherosclerotic heart disease of native coronary artery without angina pectoris: Secondary | ICD-10-CM | POA: Insufficient documentation

## 2014-04-06 DIAGNOSIS — E785 Hyperlipidemia, unspecified: Secondary | ICD-10-CM | POA: Insufficient documentation

## 2014-04-06 NOTE — Progress Notes (Signed)
Cardiac Rehab Medication Review by a Pharmacist  Does the patient  feel that his/her medications are working for him/her?  yes  Has the patient been experiencing any side effects to the medications prescribed?  no  Does the patient measure his/her own blood pressure or blood glucose at home?  yes   Does the patient have any problems obtaining medications due to transportation or finances?   no  Understanding of regimen: good Understanding of indications: good Potential of compliance: good    Pharmacist comments:  Pleasant 68 yo F presenting with a complete medication list. She has a good understanding of her regimen and states that she never misses any doses of her medications. She denies any side effects to any of her medications but does state that she feels more out of breath since her heart attack although this has significantly improved since she was changed from Brilinta to Plavix. Otherwise, she did not have any other medication concerns.   Von [place]acher May 04/06/2014 8:48 AM

## 2014-04-10 ENCOUNTER — Encounter (HOSPITAL_COMMUNITY)
Admission: RE | Admit: 2014-04-10 | Discharge: 2014-04-10 | Disposition: A | Discharge status: Home / Self Care | Payer: Medicare HMO | Source: Ambulatory Visit | Attending: Cardiovascular Disease | Admitting: Cardiovascular Disease

## 2014-04-10 ENCOUNTER — Telehealth: Payer: Self-pay | Admitting: Cardiovascular Disease

## 2014-04-10 DIAGNOSIS — I2510 Atherosclerotic heart disease of native coronary artery without angina pectoris: Secondary | ICD-10-CM | POA: Diagnosis not present

## 2014-04-10 DIAGNOSIS — I4510 Unspecified right bundle-branch block: Secondary | ICD-10-CM | POA: Diagnosis not present

## 2014-04-10 DIAGNOSIS — E785 Hyperlipidemia, unspecified: Secondary | ICD-10-CM | POA: Diagnosis not present

## 2014-04-10 DIAGNOSIS — I219 Acute myocardial infarction, unspecified: Secondary | ICD-10-CM | POA: Diagnosis not present

## 2014-04-10 DIAGNOSIS — Z5189 Encounter for other specified aftercare: Secondary | ICD-10-CM | POA: Diagnosis present

## 2014-04-10 DIAGNOSIS — R011 Cardiac murmur, unspecified: Secondary | ICD-10-CM | POA: Diagnosis present

## 2014-04-10 NOTE — Telephone Encounter (Signed)
Melissa Nixon states she thinks pt is OK. She will forward information from Cardiac Rehab today to Dr Eden Emms for his review.

## 2014-04-10 NOTE — Progress Notes (Signed)
Melissa Nixon is here today for the first day of exercise at cardiac rehab.  Telemetry rhythm Sinus with a downward QRS. Melissa Nixon reports that she has still been experiencing some shortness of breath since her brillinta was switched to plavix. Melissa Nixon says her shortness of breath has improved but it still present with exertion. Oxygen saturation 95%-100% on room air. Melissa Nixon is deconditioned she says she has not done much exercise because of the hole she has in her heart. Patient exercised at low workloads on the bike and the nustep. Melissa Nixon did complain of shortness of breath on the treadmill. Exercise stopped on the treadmill I had  the patient to rest. We will have Melissa Nixon exercise on the track on Wednesday. Dr Fabio Bering office notified of the patients complaints. Melissa Nixon denied any chest pain today. Melissa Nixon continues to smoke 5-6 cigarettes a day.Will continue to monitor the patient throughout  the program.

## 2014-04-10 NOTE — Telephone Encounter (Signed)
New Prob   Calling on behalf of pt. States pt is still experiencing SOB and would like to speak to a triage nurse. Please call.

## 2014-04-11 NOTE — Telephone Encounter (Signed)
SEE STAFF MESSAGE./CY

## 2014-04-11 NOTE — Telephone Encounter (Signed)
She is scheduled for f/u MRI to consider AICD  BNP minimally elevated ok

## 2014-04-12 ENCOUNTER — Encounter (HOSPITAL_COMMUNITY)
Admission: RE | Admit: 2014-04-12 | Discharge: 2014-04-12 | Disposition: A | Discharge status: Home / Self Care | Payer: Medicare HMO | Source: Ambulatory Visit | Attending: Cardiovascular Disease | Admitting: Cardiovascular Disease

## 2014-04-12 DIAGNOSIS — Z5189 Encounter for other specified aftercare: Secondary | ICD-10-CM | POA: Diagnosis not present

## 2014-04-12 NOTE — Progress Notes (Signed)
Patient's blood pressure noted at 180/100 during bike testing today. Patient asymptomatic recheck blood pressure 132/96 then 122/90. Mindy took her medications today as prescribed today. Exit blood pressure 102/60. [facility] notified of elevated exertional blood pressure today. Will fax exercise flow sheets to Dr. Fabio Bering office for review.

## 2014-04-14 ENCOUNTER — Encounter (HOSPITAL_COMMUNITY)
Admission: RE | Admit: 2014-04-14 | Discharge: 2014-04-14 | Disposition: A | Discharge status: Home / Self Care | Payer: Medicare HMO | Source: Ambulatory Visit | Attending: Cardiovascular Disease | Admitting: Cardiovascular Disease

## 2014-04-14 DIAGNOSIS — Z5189 Encounter for other specified aftercare: Secondary | ICD-10-CM | POA: Diagnosis not present

## 2014-04-14 NOTE — Progress Notes (Signed)
Patient given smoking cessation information and the number to 1-800-quit-now.

## 2014-04-17 ENCOUNTER — Encounter (HOSPITAL_COMMUNITY)
Admission: RE | Admit: 2014-04-17 | Discharge: 2014-04-17 | Disposition: A | Discharge status: Home / Self Care | Payer: Medicare HMO | Source: Ambulatory Visit | Attending: Cardiovascular Disease | Admitting: Cardiovascular Disease

## 2014-04-17 DIAGNOSIS — Z5189 Encounter for other specified aftercare: Secondary | ICD-10-CM | POA: Diagnosis not present

## 2014-04-17 NOTE — Progress Notes (Signed)
Reviewed home exercise with pt today.  Pt plans to walk at home and stores for exercise.  Reviewed THR, pulse, RPE, sign and symptoms, NTG use, and when to call 911 or MD.  Pt voiced understanding. Fabio Pierce, MA, ACSM RCEP

## 2014-04-19 ENCOUNTER — Encounter (HOSPITAL_COMMUNITY)
Admission: RE | Admit: 2014-04-19 | Discharge: 2014-04-19 | Disposition: A | Discharge status: Home / Self Care | Payer: Medicare HMO | Source: Ambulatory Visit | Attending: Cardiovascular Disease | Admitting: Cardiovascular Disease

## 2014-04-19 DIAGNOSIS — Z5189 Encounter for other specified aftercare: Secondary | ICD-10-CM | POA: Diagnosis not present

## 2014-04-21 ENCOUNTER — Encounter (HOSPITAL_COMMUNITY)
Admission: RE | Admit: 2014-04-21 | Discharge: 2014-04-21 | Disposition: A | Discharge status: Home / Self Care | Payer: Medicare HMO | Source: Ambulatory Visit | Attending: Cardiovascular Disease | Admitting: Cardiovascular Disease

## 2014-04-21 DIAGNOSIS — Z5189 Encounter for other specified aftercare: Secondary | ICD-10-CM | POA: Diagnosis not present

## 2014-04-24 ENCOUNTER — Encounter (HOSPITAL_COMMUNITY)
Admission: RE | Admit: 2014-04-24 | Discharge: 2014-04-24 | Disposition: A | Discharge status: Home / Self Care | Payer: Medicare HMO | Source: Ambulatory Visit | Attending: Cardiovascular Disease | Admitting: Cardiovascular Disease

## 2014-04-24 DIAGNOSIS — Z5189 Encounter for other specified aftercare: Secondary | ICD-10-CM | POA: Diagnosis not present

## 2014-04-24 NOTE — Progress Notes (Signed)
Melissa Nixon 68 y.o. female Nutrition Note Spoke with pt.  Nutrition Plan and Nutrition Survey goals reviewed with pt. Pt is following Step 2 of the Therapeutic Lifestyle Changes diet. Pt's husband is a Biomedical scientist and the primary cook. Pt wants to lose wt. Pt has been trying to lose wt by "making slow changes." Wt loss tips reviewed. Pt is trying to quit using tobacco. Pt reports she has gone from 7 to 3 cigarettes/day. Vitamin C supplement noted. Pt is pre-diabetic according to her last A1c. Pt was aware of having pre-diabetes and has discussed pre-diabetes with her PCP. Pt follows a diabetic diet due to her husband being a "diet-controlled diabetic." Pt expressed understanding of the information reviewed. Pt aware of nutrition education classes offered.  Nutrition Diagnosis   Food-and nutrition-related knowledge deficit related to lack of exposure to information as related to diagnosis of: ? CVD ? Pre-DM (A1c 6.1)    Overweight related to excessive energy intake as evidenced by a BMI of 27.0  Nutrition RX/ Estimated Daily Nutrition Needs for: wt loss  1250-1500 Kcal, 30-40 gm fat, 9-11 gm sat fat, 1.2-1.5 gm trans-fat, <1500 mg sodium  Nutrition Intervention   Pt's individual nutrition plan reviewed with pt.   Benefits of adopting Therapeutic Lifestyle Changes discussed when Medficts reviewed.   Pt to attend the Portion Distortion class - met; 04/12/14   Pt to attend the  ? Nutrition I class                     ? Nutrition II class   Continue client-centered nutrition education by RD, as part of interdisciplinary care. Goal(s)   Pt to identify food quantities necessary to achieve: ? wt loss to a goal wt of 142-160 lb (64.9-73.1 kg) at graduation from cardiac rehab.  Monitor and Evaluate progress toward nutrition goal with team. Nutrition Risk: Change to Moderate Derek Mound, M.Ed, RD, LDN, CDE 04/24/2014 3:38 PM

## 2014-04-26 ENCOUNTER — Telehealth: Payer: Self-pay | Admitting: Cardiovascular Disease

## 2014-04-26 ENCOUNTER — Encounter (HOSPITAL_COMMUNITY)
Admission: RE | Admit: 2014-04-26 | Discharge: 2014-04-26 | Disposition: A | Discharge status: Home / Self Care | Payer: Medicare HMO | Source: Ambulatory Visit | Attending: Cardiovascular Disease | Admitting: Cardiovascular Disease

## 2014-04-26 DIAGNOSIS — Z9861 Coronary angioplasty status: Secondary | ICD-10-CM | POA: Insufficient documentation

## 2014-04-26 DIAGNOSIS — I219 Acute myocardial infarction, unspecified: Secondary | ICD-10-CM | POA: Diagnosis present

## 2014-04-26 NOTE — Progress Notes (Signed)
Melissa Nixon says she was told by her insurance company that she need a second opinion prior to having her MRI.  Patient called Dr Fabio Bering office to cancel her test for Monday.

## 2014-04-26 NOTE — Telephone Encounter (Signed)
LMTCB

## 2014-04-26 NOTE — Telephone Encounter (Signed)
The pt states that an MRI is too expensive and her insurance will only cover a small amount of the cost. She states that she wants a second opinion before she has the MRI. Will forward message to Dr Eden Emms and his nurse Wynona Canes as an Lorain Childes

## 2014-04-26 NOTE — Telephone Encounter (Signed)
Follow Up:  Pt is returning call to [facility].

## 2014-04-26 NOTE — Telephone Encounter (Signed)
New message    Patient wants cardiac mri cancel & she getting a second opinion.

## 2014-05-01 ENCOUNTER — Ambulatory Visit (HOSPITAL_COMMUNITY): Payer: Medicare HMO

## 2014-05-01 ENCOUNTER — Encounter (HOSPITAL_COMMUNITY)
Admission: RE | Admit: 2014-05-01 | Discharge: 2014-05-01 | Disposition: A | Discharge status: Home / Self Care | Payer: Medicare HMO | Source: Ambulatory Visit | Attending: Cardiovascular Disease | Admitting: Cardiovascular Disease

## 2014-05-01 DIAGNOSIS — I219 Acute myocardial infarction, unspecified: Secondary | ICD-10-CM | POA: Diagnosis not present

## 2014-05-03 ENCOUNTER — Encounter (HOSPITAL_COMMUNITY)
Admission: RE | Admit: 2014-05-03 | Discharge: 2014-05-03 | Disposition: A | Discharge status: Home / Self Care | Payer: Medicare HMO | Source: Ambulatory Visit | Attending: Cardiovascular Disease | Admitting: Cardiovascular Disease

## 2014-05-03 DIAGNOSIS — I219 Acute myocardial infarction, unspecified: Secondary | ICD-10-CM | POA: Diagnosis not present

## 2014-05-05 ENCOUNTER — Encounter (HOSPITAL_COMMUNITY)
Admission: RE | Admit: 2014-05-05 | Discharge: 2014-05-05 | Disposition: A | Discharge status: Home / Self Care | Payer: Medicare HMO | Source: Ambulatory Visit | Attending: Cardiovascular Disease | Admitting: Cardiovascular Disease

## 2014-05-05 DIAGNOSIS — I219 Acute myocardial infarction, unspecified: Secondary | ICD-10-CM | POA: Diagnosis not present

## 2014-05-08 ENCOUNTER — Encounter (HOSPITAL_COMMUNITY)
Admission: RE | Admit: 2014-05-08 | Discharge: 2014-05-08 | Disposition: A | Discharge status: Home / Self Care | Payer: Medicare HMO | Source: Ambulatory Visit | Attending: Cardiovascular Disease | Admitting: Cardiovascular Disease

## 2014-05-08 DIAGNOSIS — I219 Acute myocardial infarction, unspecified: Secondary | ICD-10-CM | POA: Diagnosis not present

## 2014-05-10 ENCOUNTER — Encounter (HOSPITAL_COMMUNITY)
Admission: RE | Admit: 2014-05-10 | Discharge: 2014-05-10 | Disposition: A | Discharge status: Home / Self Care | Payer: Medicare HMO | Source: Ambulatory Visit | Attending: Cardiovascular Disease | Admitting: Cardiovascular Disease

## 2014-05-10 DIAGNOSIS — I219 Acute myocardial infarction, unspecified: Secondary | ICD-10-CM | POA: Diagnosis not present

## 2014-05-12 ENCOUNTER — Encounter (HOSPITAL_COMMUNITY)
Admission: RE | Admit: 2014-05-12 | Discharge: 2014-05-12 | Disposition: A | Discharge status: Home / Self Care | Payer: Medicare HMO | Source: Ambulatory Visit | Attending: Cardiovascular Disease | Admitting: Cardiovascular Disease

## 2014-05-12 DIAGNOSIS — I219 Acute myocardial infarction, unspecified: Secondary | ICD-10-CM | POA: Diagnosis not present

## 2014-05-15 ENCOUNTER — Encounter (HOSPITAL_COMMUNITY)
Admission: RE | Admit: 2014-05-15 | Discharge: 2014-05-15 | Disposition: A | Discharge status: Home / Self Care | Payer: Medicare HMO | Source: Ambulatory Visit | Attending: Cardiovascular Disease | Admitting: Cardiovascular Disease

## 2014-05-15 DIAGNOSIS — I219 Acute myocardial infarction, unspecified: Secondary | ICD-10-CM | POA: Diagnosis not present

## 2014-05-17 ENCOUNTER — Encounter (HOSPITAL_COMMUNITY)
Admission: RE | Admit: 2014-05-17 | Discharge: 2014-05-17 | Disposition: A | Discharge status: Home / Self Care | Payer: Medicare HMO | Source: Ambulatory Visit | Attending: Cardiovascular Disease | Admitting: Cardiovascular Disease

## 2014-05-17 DIAGNOSIS — I219 Acute myocardial infarction, unspecified: Secondary | ICD-10-CM | POA: Diagnosis not present

## 2014-05-19 ENCOUNTER — Encounter (HOSPITAL_COMMUNITY)
Admission: RE | Admit: 2014-05-19 | Discharge: 2014-05-19 | Disposition: A | Discharge status: Home / Self Care | Payer: Medicare HMO | Source: Ambulatory Visit | Attending: Cardiovascular Disease | Admitting: Cardiovascular Disease

## 2014-05-19 DIAGNOSIS — I219 Acute myocardial infarction, unspecified: Secondary | ICD-10-CM | POA: Diagnosis not present

## 2014-05-22 ENCOUNTER — Encounter (HOSPITAL_COMMUNITY)
Admission: RE | Admit: 2014-05-22 | Discharge: 2014-05-22 | Disposition: A | Discharge status: Home / Self Care | Payer: Medicare HMO | Source: Ambulatory Visit | Attending: Cardiovascular Disease | Admitting: Cardiovascular Disease

## 2014-05-22 DIAGNOSIS — I219 Acute myocardial infarction, unspecified: Secondary | ICD-10-CM | POA: Diagnosis not present

## 2014-05-24 ENCOUNTER — Encounter (HOSPITAL_COMMUNITY)
Admission: RE | Admit: 2014-05-24 | Discharge: 2014-05-24 | Disposition: A | Discharge status: Home / Self Care | Payer: Medicare HMO | Source: Ambulatory Visit | Attending: Cardiovascular Disease | Admitting: Cardiovascular Disease

## 2014-05-24 DIAGNOSIS — I219 Acute myocardial infarction, unspecified: Secondary | ICD-10-CM | POA: Diagnosis not present

## 2014-05-26 ENCOUNTER — Encounter (HOSPITAL_COMMUNITY): Payer: Medicare HMO

## 2014-05-29 ENCOUNTER — Encounter (HOSPITAL_COMMUNITY): Admission: RE | Admit: 2014-05-29 | Payer: Medicare HMO | Source: Ambulatory Visit

## 2014-05-29 ENCOUNTER — Telehealth (HOSPITAL_COMMUNITY): Payer: Self-pay | Admitting: Cardiac Rehabilitation

## 2014-05-29 NOTE — Telephone Encounter (Signed)
Pt arrived at cardiac rehab today reporting diarrhea/loose stools since yesterday.  Pt states symptoms began [place]  Pt reports loose stools have decreased in frequency however she has not eaten very much today. Pt instructed to avoid exercise today, go home to rest, drink plenty of clear liquids and eat bland non fat, non diary food in small frequent portions.  Pt instructed to avoid cardiac rehab until symptoms absent for 24-48 hours.  Understanding verbalized

## 2014-05-31 ENCOUNTER — Encounter (HOSPITAL_COMMUNITY)
Admission: RE | Admit: 2014-05-31 | Discharge: 2014-05-31 | Disposition: A | Discharge status: Home / Self Care | Payer: Medicare HMO | Source: Ambulatory Visit | Attending: Cardiovascular Disease | Admitting: Cardiovascular Disease

## 2014-05-31 DIAGNOSIS — Z9861 Coronary angioplasty status: Secondary | ICD-10-CM | POA: Diagnosis present

## 2014-05-31 DIAGNOSIS — I219 Acute myocardial infarction, unspecified: Secondary | ICD-10-CM | POA: Diagnosis present

## 2014-06-02 ENCOUNTER — Encounter (HOSPITAL_COMMUNITY)
Admission: RE | Admit: 2014-06-02 | Discharge: 2014-06-02 | Disposition: A | Discharge status: Home / Self Care | Payer: Medicare HMO | Source: Ambulatory Visit | Attending: Cardiovascular Disease | Admitting: Cardiovascular Disease

## 2014-06-02 DIAGNOSIS — I219 Acute myocardial infarction, unspecified: Secondary | ICD-10-CM | POA: Diagnosis not present

## 2014-06-05 ENCOUNTER — Encounter (HOSPITAL_COMMUNITY)
Admission: RE | Admit: 2014-06-05 | Discharge: 2014-06-05 | Disposition: A | Discharge status: Home / Self Care | Payer: Medicare HMO | Source: Ambulatory Visit | Attending: Cardiovascular Disease | Admitting: Cardiovascular Disease

## 2014-06-05 DIAGNOSIS — I219 Acute myocardial infarction, unspecified: Secondary | ICD-10-CM | POA: Diagnosis not present

## 2014-06-07 ENCOUNTER — Encounter (HOSPITAL_COMMUNITY)
Admission: RE | Admit: 2014-06-07 | Discharge: 2014-06-07 | Disposition: A | Discharge status: Home / Self Care | Payer: Medicare HMO | Source: Ambulatory Visit | Attending: Cardiovascular Disease | Admitting: Cardiovascular Disease

## 2014-06-07 DIAGNOSIS — I219 Acute myocardial infarction, unspecified: Secondary | ICD-10-CM | POA: Diagnosis not present

## 2014-06-09 ENCOUNTER — Encounter (HOSPITAL_COMMUNITY)
Admission: RE | Admit: 2014-06-09 | Discharge: 2014-06-09 | Disposition: A | Discharge status: Home / Self Care | Payer: Medicare HMO | Source: Ambulatory Visit | Attending: Cardiovascular Disease | Admitting: Cardiovascular Disease

## 2014-06-09 DIAGNOSIS — I219 Acute myocardial infarction, unspecified: Secondary | ICD-10-CM | POA: Diagnosis not present

## 2014-06-12 ENCOUNTER — Encounter (HOSPITAL_COMMUNITY)
Admission: RE | Admit: 2014-06-12 | Discharge: 2014-06-12 | Disposition: A | Discharge status: Home / Self Care | Payer: Medicare HMO | Source: Ambulatory Visit | Attending: Cardiovascular Disease | Admitting: Cardiovascular Disease

## 2014-06-12 DIAGNOSIS — I219 Acute myocardial infarction, unspecified: Secondary | ICD-10-CM | POA: Diagnosis not present

## 2014-06-14 ENCOUNTER — Telehealth (HOSPITAL_COMMUNITY): Payer: Self-pay | Admitting: Family Medicine

## 2014-06-14 ENCOUNTER — Encounter (HOSPITAL_COMMUNITY): Payer: Medicare HMO

## 2014-06-15 ENCOUNTER — Other Ambulatory Visit: Payer: Self-pay | Admitting: Cardiovascular Disease

## 2014-06-16 ENCOUNTER — Encounter (HOSPITAL_COMMUNITY): Payer: Medicare HMO

## 2014-06-19 ENCOUNTER — Encounter (HOSPITAL_COMMUNITY): Payer: Medicare HMO

## 2014-06-20 ENCOUNTER — Telehealth (HOSPITAL_COMMUNITY): Payer: Self-pay | Admitting: *Deleted

## 2014-06-21 ENCOUNTER — Encounter (HOSPITAL_COMMUNITY): Payer: Medicare HMO

## 2014-06-23 ENCOUNTER — Encounter (HOSPITAL_COMMUNITY): Payer: Medicare HMO

## 2014-06-26 ENCOUNTER — Encounter (HOSPITAL_COMMUNITY): Payer: Medicare HMO

## 2014-06-28 ENCOUNTER — Encounter (HOSPITAL_COMMUNITY): Payer: Medicare HMO

## 2014-06-30 ENCOUNTER — Encounter (HOSPITAL_COMMUNITY): Payer: Medicare HMO

## 2014-07-05 ENCOUNTER — Encounter (HOSPITAL_COMMUNITY): Payer: Medicare HMO

## 2014-07-07 ENCOUNTER — Encounter (HOSPITAL_COMMUNITY): Payer: Medicare HMO

## 2014-07-10 ENCOUNTER — Encounter (HOSPITAL_COMMUNITY): Payer: Medicare HMO

## 2014-07-12 ENCOUNTER — Encounter (HOSPITAL_COMMUNITY): Payer: Medicare HMO

## 2014-07-14 ENCOUNTER — Encounter (HOSPITAL_COMMUNITY): Payer: Medicare HMO

## 2014-08-22 ENCOUNTER — Encounter: Payer: Self-pay | Admitting: Family Medicine

## 2014-08-22 ENCOUNTER — Ambulatory Visit (INDEPENDENT_AMBULATORY_CARE_PROVIDER_SITE_OTHER): Payer: Commercial Managed Care - HMO | Admitting: Family Medicine

## 2014-08-22 VITALS — BP 128/86 | HR 78 | Temp 97.9°F | Ht 66.0 in | Wt 166.1 lb

## 2014-08-22 DIAGNOSIS — R011 Cardiac murmur, unspecified: Secondary | ICD-10-CM

## 2014-08-22 DIAGNOSIS — I2510 Atherosclerotic heart disease of native coronary artery without angina pectoris: Secondary | ICD-10-CM

## 2014-08-22 DIAGNOSIS — I10 Essential (primary) hypertension: Secondary | ICD-10-CM

## 2014-08-22 DIAGNOSIS — I2102 ST elevation (STEMI) myocardial infarction involving left anterior descending coronary artery: Secondary | ICD-10-CM

## 2014-08-22 DIAGNOSIS — Z23 Encounter for immunization: Secondary | ICD-10-CM

## 2014-08-22 DIAGNOSIS — I4510 Unspecified right bundle-branch block: Secondary | ICD-10-CM

## 2014-08-22 DIAGNOSIS — R739 Hyperglycemia, unspecified: Secondary | ICD-10-CM

## 2014-08-22 DIAGNOSIS — I519 Heart disease, unspecified: Secondary | ICD-10-CM

## 2014-08-22 NOTE — Patient Instructions (Addendum)
BEFORE YOU LEAVE: -flu shot -prevar 13 -labs -schedule medicare wellness exam  Please schedule appointment with your cardiologist and make sure you keep this appointment. Get cholesterol check with cardiologist if fasting or at physical in next 3 months (MEDICARE WELLNESS EXAM)  -We have ordered labs or studies at this visit. It can take up to 1-2 weeks for results and processing. We will contact you with instructions IF your results are abnormal. Normal results will be released to your [facility]. If you have not heard from [place] or can not find your results in [facility] in 2 weeks please contact our office.  Quit smoking. Call quit line.  Please ask the cardiologist about whether or not you need to take the furosemide (lasix) and the losartan (cozaar)

## 2014-08-22 NOTE — Progress Notes (Signed)
No chief complaint on file.   HPI:  STEMI/CAD/HTN/HLD: -DES to LAD 3/27 -followed by Dr. Eden EmmsNishan - reports she wants to change cardiologist to Dr. Jearld PiesMcClean because he has taken care of her husband  - reports needs referral for this -on asa, plavix, lasix (not taking because has run out), statin, BB, ?arb (she doesn't think she is taking this and not on med list - but on list at last appt with cardiologist) for CAD, HTN, HLD -reports:finished cardiac rehab, reports doing well, walking 3 days per week; working on diet and lost weight intentionally -denies: CP, palpations, swelling, DOE, SOB, weight gain   GERD: -taking pantoprazole for this, reports stable  Tobacco Use: -3-4 cigerettes per day -had not had lung cancer screening  - not interested -did not like chantix, going to try to quit on her own  Allergic Rhinitis: -stable -wheezing, SOB  Prediabetes: -have advised healthy low carb diet and exercise -denies: polyuria, polydipsia, vision changes  Osteoporosis: -on fosamax  ROS: See pertinent positives and negatives per HPI.  Past Medical History  Diagnosis Date  . Cholecystitis   . HYPERTENSION   . Allergic rhinitis due to other allergen   . GERD   . ELECTROCARDIOGRAM, ABNORMAL   . LOW BACK PAIN 01/02/2010    Qualifier: Diagnosis of  By: Yetta BarreJones MD, Bernadene Bellhomas L.   . Osteoporosis, dexa 2014, fosamax, cal, vit D and exercise advised 06/01/2013  . Murmur, hx VSD, evaluated by cardiology in 2013 06/03/2012  . CAD, multiple vessel, residual disease LCX 40% OM 01/22/2014  . STEMI (ST elevation myocardial infarction), 01/20/14, LAD PCI/Stent DES Promus preimer 01/20/2014  . Hyperlipidemia LDL goal < 70 01/22/2014  . RBBB, chronic 01/22/2014  . Tobacco use, stopped in 2013 01/22/2014  . LV dysfunction, EF at cath 45-50% 01/22/2014  . Hyperglycemia 01/22/2014    Past Surgical History  Procedure Laterality Date  . Laparoscopic cholecystectomy    . Abdominal hysterectomy  1983  . Coronary  angioplasty with stent placement  01/20/2014    Promus preimer to LAD with STEMI    Family History  Problem Relation Age of Onset  . Diabetes    . Hypertension      History   Social History  . Marital Status: Married    Spouse Name: N/A    Number of Children: N/A  . Years of Education: N/A   Social History Main Topics  . Smoking status: Current Every Day Smoker -- 1.00 packs/day    Types: Cigarettes  . Smokeless tobacco: None  . Alcohol Use: No  . Drug Use: No  . Sexual Activity: None   Other Topics Concern  . None   Social History Narrative  . None    Current outpatient prescriptions:alendronate (FOSAMAX) 10 MG tablet, Take 1 tablet (10 mg total) by mouth daily before breakfast. Take with a full glass of water on an empty stomach., Disp: 90 tablet, Rfl: 3;  ascorbic acid (VITAMIN C) 1000 MG tablet, Take 1,000 mg by mouth daily., Disp: , Rfl: ;  aspirin 81 MG tablet, Take 81 mg by mouth daily., Disp: , Rfl:  atorvastatin (LIPITOR) 40 MG tablet, TAKE 1 TABLET DAILY AT 6 PM, Disp: 90 tablet, Rfl: 0;  Cholecalciferol (VITAMIN D PO), Take 1 tablet by mouth daily. , Disp: , Rfl: ;  clopidogrel (PLAVIX) 75 MG tablet, Take 1 tablet (75 mg total) by mouth daily., Disp: 90 tablet, Rfl: 3;  Loratadine-Pseudoephedrine (CLARITIN-D 12 HOUR PO), Take 1 tablet by mouth  daily. , Disp: , Rfl:  metoprolol tartrate (LOPRESSOR) 25 MG tablet, Take 1 tablet (25 mg total) by mouth 2 (two) times daily., Disp: 180 tablet, Rfl: 1;  nitroGLYCERIN (NITROSTAT) 0.4 MG SL tablet, Place 1 tablet (0.4 mg total) under the tongue every 5 (five) minutes x 3 doses as needed for chest pain., Disp: 25 tablet, Rfl: 4;  pantoprazole (PROTONIX) 40 MG tablet, Take 1 tablet (40 mg total) by mouth daily., Disp: 90 tablet, Rfl: 3 furosemide (LASIX) 20 MG tablet, TAKE 1 TABLET EVERY DAY, Disp: 90 tablet, Rfl: 0;  potassium chloride SA (K-DUR,KLOR-CON) 20 MEQ tablet, TAKE 1 TABLET TWICE DAILY, Disp: 180 tablet, Rfl:  0  EXAM:  Filed Vitals:   08/22/14 1317  BP: 128/86  Pulse: 78  Temp: 97.9 F (36.6 C)    Body mass index is 26.82 kg/(m^2).  GENERAL: vitals reviewed and listed above, alert, oriented, appears well hydrated and in no acute distress  HEENT: atraumatic, conjunttiva clear, no obvious abnormalities on inspection of external nose and ears  NECK: no obvious masses on inspection  LUNGS: clear to auscultation bilaterally, no wheezes, rales or rhonchi, good air movement  CV: HRRR, no peripheral edema  MS: moves all extremities without noticeable abnormality  PSYCH: pleasant and cooperative, no obvious depression or anxiety  ASSESSMENT AND PLAN:  Discussed the following assessment and plan:  RBBB, chronic - Plan: Ambulatory referral to Cardiology  Essential hypertension - Plan: Ambulatory referral to Cardiology, Basic metabolic panel  Murmur, hx VSD, evaluated by cardiology in 2013 - Plan: Ambulatory referral to Cardiology  ST elevation myocardial infarction involving left anterior descending (LAD) coronary artery - Plan: Ambulatory referral to Cardiology  CAD, multiple vessel, residual disease LCX 40% OM - Plan: Ambulatory referral to Cardiology  LV dysfunction, EF at cath 45-50% - Plan: Ambulatory referral to Cardiology  Hyperglycemia - Plan: Hemoglobin A1c  -smoking cessation counseling >3 minutes; stressed importance of cessation and avoidance of 2nd hand smoke -healthy diet and regular exercise advised -advised appt with her cardiologist as she is reluctant to restart arb or lasix given BP ok - referral placed per her request and requested Dr. Jearld Pies as she reports he told her husband he will see her and she refuses to see anyone else - advised she discuss the arb and lasix with him -she agreed to flu and pneumonia vaccines today -advised she check fasting lipids with cardiologist or at medicare exam as she prefers not to return just for that -discussed lung cancer  screening recently- she declined  -follow up for medicare wellness exam    Patient Instructions  BEFORE YOU LEAVE: -flu shot -prevar 13 -labs -schedule medicare wellness exam  Please schedule appointment with your cardiologist and make sure you keep this appointment. Get cholesterol check with cardiologist if fasting or at physical in next 3 months (MEDICARE WELLNESS EXAM)  -We have ordered labs or studies at this visit. It can take up to 1-2 weeks for results and processing. We will contact you with instructions IF your results are abnormal. Normal results will be released to your [facility]. If you have not heard from [place] or can not find your results in [facility] in 2 weeks please contact our office.  Quit smoking. Call quit line.  Please ask the cardiologist about whether or not you need to take the furosemide (lasix) and the losartan (cozaar)              Melissa Nixon R.

## 2014-08-22 NOTE — Addendum Note (Signed)
Addended by: Johnella Moloney on: 08/22/2014 02:06 PM   Modules accepted: Orders

## 2014-08-22 NOTE — Progress Notes (Signed)
Pre visit review using our clinic review tool, if applicable. No additional management support is needed unless otherwise documented below in the visit note. 

## 2014-08-24 ENCOUNTER — Other Ambulatory Visit: Payer: Self-pay | Admitting: Cardiovascular Disease

## 2014-09-14 ENCOUNTER — Encounter: Payer: Self-pay | Admitting: Cardiovascular Disease

## 2014-09-15 ENCOUNTER — Ambulatory Visit (INDEPENDENT_AMBULATORY_CARE_PROVIDER_SITE_OTHER): Payer: Commercial Managed Care - HMO | Admitting: Cardiology

## 2014-09-15 ENCOUNTER — Encounter: Payer: Self-pay | Admitting: Cardiology

## 2014-09-15 VITALS — BP 130/80 | HR 88 | Ht 66.0 in | Wt 162.8 lb

## 2014-09-15 DIAGNOSIS — I519 Heart disease, unspecified: Secondary | ICD-10-CM

## 2014-09-15 DIAGNOSIS — I10 Essential (primary) hypertension: Secondary | ICD-10-CM

## 2014-09-15 DIAGNOSIS — I2510 Atherosclerotic heart disease of native coronary artery without angina pectoris: Secondary | ICD-10-CM

## 2014-09-15 MED ORDER — LISINOPRIL 5 MG PO TABS: 5.0000 mg | ORAL_TABLET | Freq: Every day | ORAL | Status: DC

## 2014-09-15 NOTE — Patient Instructions (Signed)
LAB WORK TODAY; BMET   YOU WILL NEED FASTING LIPID AND LIVER PANEL DONE THE SAME DAY YOU HAVE ECHO  Your physician has requested that you have an echocardiogram. Echocardiography is a painless test that uses sound waves to create images of your heart. It provides your doctor with information about the size and shape of your heart and how well your heart's chambers and valves are working. This procedure takes approximately one hour. There are no restrictions for this procedure.  Your physician has recommended you make the following change in your medication:  START LISINOPRIL 5 MG DAILY; RX SENT IN TODAY  Your physician recommends that you schedule a follow-up appointment in: 3 MONTHS WITH DR. Shirlee Latch

## 2014-09-15 NOTE — Progress Notes (Signed)
09/15/2014 Melissa Nixon   11-Jan-1946  [id number]  Primary Physician Terressa KoyanagiKIM, HANNAH R., DO Primary Cardiologist: Dr. Shirlee LatchMcLean  HPI:  Mrs. Melissa Nixon presents to clinic today for routine follow-up. She is a 68 year old female with a history of CAD status post anterior STEMI on 01/20/2014, subsequent to acute occlusion of the proximal LAD. This was successfully treated with emergent PCI plus drug-eluting stenting. She was initially placed on dual antiplatelet therapy with aspirin and Brilinta, however she developed significant dyspnea with Brilinta and was changed to Plavix therapy. P2Y12 testing revealed that she is a Plavix responder. Post STEMI, she was also noted to have severe left ventricular systolic function. An echocardiogram revealed reduced systolic function with an EF of 35-40%. It was recommended that she undergo repeat evaluation with a cardiac MRI, 3 months post STEMI, to assess need for AICD, however the patient failed to get this done. She reports that this was going to be too expensive for her. Her history is also notable for ongoing tobacco abuse, hypertension and hyperlipidemia.  Today in clinic, she states she has been doing fairly well since her last office visit. She denies any recurrent anginal symptoms including no chest pain and no dyspnea. She also denies palpitations, lightheadedness, dizziness, syncope/near-syncope. No orthopnea, PND or lower extremity edema. She reports medication compliance, however there appears to be some discrepancy between her medication list that she brings with her and the medication list that we have in our office records. It appears that she has not been taking losartan. However, she reports that she has been taking aspirin, Plavix, metoprolol and Lipitor. Unfortunately, she continues to smoke.    Current Outpatient Prescriptions  Medication Sig Dispense Refill  . alendronate (FOSAMAX) 10 MG tablet Take 1 tablet (10 mg total) by mouth daily before  breakfast. Take with a full glass of water on an empty stomach. 90 tablet 3  . ascorbic acid (VITAMIN C) 1000 MG tablet Take 1,000 mg by mouth daily.    Marland Kitchen. aspirin 81 MG tablet Take 81 mg by mouth daily.    Marland Kitchen. atorvastatin (LIPITOR) 40 MG tablet TAKE 1 TABLET DAILY AT 6PM 90 tablet 0  . Cholecalciferol (VITAMIN D PO) Take 1 tablet by mouth daily.     . clopidogrel (PLAVIX) 75 MG tablet Take 1 tablet (75 mg total) by mouth daily. 90 tablet 3  . furosemide (LASIX) 20 MG tablet TAKE 1 TABLET EVERY DAY 90 tablet 0  . Loratadine-Pseudoephedrine (CLARITIN-D 12 HOUR PO) Take 1 tablet by mouth daily.     . metoprolol tartrate (LOPRESSOR) 25 MG tablet TAKE 1 TABLET TWICE DAILY 180 tablet 0  . pantoprazole (PROTONIX) 40 MG tablet Take 1 tablet (40 mg total) by mouth daily. 90 tablet 3  . potassium chloride SA (K-DUR,KLOR-CON) 20 MEQ tablet TAKE 1 TABLET TWICE DAILY 180 tablet 0  . nitroGLYCERIN (NITROSTAT) 0.4 MG SL tablet Place 1 tablet (0.4 mg total) under the tongue every 5 (five) minutes x 3 doses as needed for chest pain. 25 tablet 4   No current facility-administered medications for this visit.    Allergies  Allergen Reactions  . Mushroom Extract Complex Other (See Comments)    Heart race  . Other Other (See Comments)    Olives:heart race  . Tetracyclines & Related Other (See Comments)    Yeast infections    History   Social History  . Marital Status: Married    Spouse Name: N/A    Number of Children: N/A  .  Years of Education: N/A   Occupational History  . Not on file.   Social History Main Topics  . Smoking status: Current Every Day Smoker -- 1.00 packs/day    Types: Cigarettes  . Smokeless tobacco: Not on file  . Alcohol Use: No  . Drug Use: No  . Sexual Activity: Not on file   Other Topics Concern  . Not on file   Social History Narrative     Review of Systems: General: negative for chills, fever, night sweats or weight changes.  Cardiovascular: negative for chest  pain, dyspnea on exertion, edema, orthopnea, palpitations, paroxysmal nocturnal dyspnea or shortness of breath Dermatological: negative for rash Respiratory: negative for cough or wheezing Urologic: negative for hematuria Abdominal: negative for nausea, vomiting, diarrhea, bright red blood per rectum, melena, or hematemesis Neurologic: negative for visual changes, syncope, or dizziness All other systems reviewed and are otherwise negative except as noted above.    Blood pressure 130/80, pulse 88, height 5\' 6"  (1.676 m), weight 162 lb 12.8 oz (73.846 kg).  General appearance: alert, cooperative and no distress Neck: no carotid bruit and no JVD Lungs: clear to auscultation bilaterally Heart: regular rate and rhythm, S1, S2 normal, no murmur, click, rub or gallop Extremities: no LEE Pulses: 2+ and symmetric Skin: warm and dry Neurologic: Grossly normal    ASSESSMENT AND PLAN:   1. CAD: Status post anterior STEMI 12/2013 with subsequent PCI plus drug-eluting stenting of proximal LAD. No recurrent anginal symptoms. Continue medical therapy with dual antiplatelet therapy with aspirin plus Plavix, beta blocker and statin.  2. LV dysfunction: Post STEMI ejection fraction was estimated to be 35-40%. It appears that she is on a beta blocker but is not on ACE/ARB therapy. Her beta blocker however his Lopressor. I recommended that we switch to Coreg 6.25 mg twice a day. We will also start her on ACE inhibitor therapy with 5 mg of lisinopril daily. Will check a BMP to assess both renal function and potassium. It was also recommended that she undergo evaluation with a cardiac MRI, 3 months post STEMI, however the patient failed to get this done due to cost of the study. This was to be done to evaluate need for AICD. I have recommended that she undergo evaluation with a 2-D echocardiogram to reassess systolic function.  3. Tobacco abuse: Smoking cessation was strongly advised.  4. Hypertension: Blood  pressure is controlled. In the 130s systolic. Will add low dose lisinopril in the setting of LV dysfunction.  5. Hyperlipidemia: Continue statin therapy with Lipitor. She is due for repeat lipid panel. Will obtain fasting lipid panel as well as LFTs.  PLAN  See detailed plan above. F/U with Dr. Shirlee Latch in 6-8 weeks.   SIMMONS, BRITTAINYPA-C 09/15/2014 2:51 PM

## 2014-09-16 LAB — BASIC METABOLIC PANEL
BUN: 15 mg/dL (ref 6–23)
CHLORIDE: 98 meq/L (ref 96–112)
CO2: 26 meq/L (ref 19–32)
CREATININE: 1.01 mg/dL (ref 0.50–1.10)
Calcium: 9.5 mg/dL (ref 8.4–10.5)
GLUCOSE: 105 mg/dL — AB (ref 70–99)
Potassium: 4.3 mEq/L (ref 3.5–5.3)
Sodium: 136 mEq/L (ref 135–145)

## 2014-09-25 ENCOUNTER — Other Ambulatory Visit (INDEPENDENT_AMBULATORY_CARE_PROVIDER_SITE_OTHER): Payer: Commercial Managed Care - HMO | Admitting: *Deleted

## 2014-09-25 ENCOUNTER — Ambulatory Visit (HOSPITAL_COMMUNITY): Payer: Commercial Managed Care - HMO | Attending: Cardiovascular Disease | Admitting: Cardiology

## 2014-09-25 DIAGNOSIS — I10 Essential (primary) hypertension: Secondary | ICD-10-CM | POA: Diagnosis not present

## 2014-09-25 DIAGNOSIS — Z720 Tobacco use: Secondary | ICD-10-CM | POA: Insufficient documentation

## 2014-09-25 DIAGNOSIS — I2510 Atherosclerotic heart disease of native coronary artery without angina pectoris: Secondary | ICD-10-CM

## 2014-09-25 DIAGNOSIS — I519 Heart disease, unspecified: Secondary | ICD-10-CM

## 2014-09-25 DIAGNOSIS — E785 Hyperlipidemia, unspecified: Secondary | ICD-10-CM | POA: Insufficient documentation

## 2014-09-25 LAB — LIPID PANEL
CHOL/HDL RATIO: 3
Cholesterol: 127 mg/dL (ref 0–200)
HDL: 39.4 mg/dL (ref >39.00)
LDL Cholesterol: 65 mg/dL (ref 0–99)
NONHDL: 87.6
Triglycerides: 111 mg/dL (ref 0.0–149.0)
VLDL: 22.2 mg/dL (ref 0.0–40.0)

## 2014-09-25 LAB — HEPATIC FUNCTION PANEL
ALT: 21 U/L (ref 0–35)
AST: 19 U/L (ref 0–37)
Albumin: 4 g/dL (ref 3.5–5.2)
Alkaline Phosphatase: 73 U/L (ref 39–117)
BILIRUBIN TOTAL: 0.8 mg/dL (ref 0.2–1.2)
Bilirubin, Direct: 0.1 mg/dL (ref 0.0–0.3)
Total Protein: 7.6 g/dL (ref 6.0–8.3)

## 2014-09-25 NOTE — Progress Notes (Signed)
Echo performed. 

## 2014-10-05 ENCOUNTER — Encounter (HOSPITAL_COMMUNITY): Payer: Self-pay | Admitting: Cardiovascular Disease

## 2014-12-20 ENCOUNTER — Ambulatory Visit (INDEPENDENT_AMBULATORY_CARE_PROVIDER_SITE_OTHER): Payer: Commercial Managed Care - HMO | Admitting: Cardiology

## 2014-12-20 ENCOUNTER — Encounter: Payer: Self-pay | Admitting: *Deleted

## 2014-12-20 ENCOUNTER — Encounter: Payer: Self-pay | Admitting: Cardiology

## 2014-12-20 VITALS — BP 118/80 | HR 88 | Ht 66.0 in | Wt 160.0 lb

## 2014-12-20 DIAGNOSIS — I2510 Atherosclerotic heart disease of native coronary artery without angina pectoris: Secondary | ICD-10-CM | POA: Diagnosis not present

## 2014-12-20 DIAGNOSIS — R0602 Shortness of breath: Secondary | ICD-10-CM

## 2014-12-20 DIAGNOSIS — E785 Hyperlipidemia, unspecified: Secondary | ICD-10-CM | POA: Diagnosis not present

## 2014-12-20 MED ORDER — LOSARTAN POTASSIUM 25 MG PO TABS: ORAL_TABLET | ORAL | Status: DC

## 2014-12-20 MED ORDER — METOPROLOL TARTRATE 25 MG PO TABS: 25.0000 mg | ORAL_TABLET | Freq: Two times a day (BID) | ORAL | Status: DC

## 2014-12-20 MED ORDER — ATORVASTATIN CALCIUM 40 MG PO TABS: ORAL_TABLET | ORAL | Status: DC

## 2014-12-20 MED ORDER — NITROGLYCERIN 0.4 MG SL SUBL: 0.4000 mg | SUBLINGUAL_TABLET | SUBLINGUAL | Status: DC | PRN

## 2014-12-20 MED ORDER — CLOPIDOGREL BISULFATE 75 MG PO TABS: 75.0000 mg | ORAL_TABLET | Freq: Every day | ORAL | Status: DC

## 2014-12-20 MED ORDER — FUROSEMIDE 20 MG PO TABS: 20.0000 mg | ORAL_TABLET | Freq: Every day | ORAL | Status: DC

## 2014-12-20 MED ORDER — POTASSIUM CHLORIDE CRYS ER 20 MEQ PO TBCR: 20.0000 meq | EXTENDED_RELEASE_TABLET | Freq: Two times a day (BID) | ORAL | Status: DC

## 2014-12-20 NOTE — Progress Notes (Signed)
Patient ID: Melissa Nixon, female   DOB: Jun 30, 1946, 69 y.o.   MRN: [id number] PCP: Dr. Selena Batten  69 yo with history of active smoking, CAD s/p anterior MI, and ischemic cardiomyopathy presents for cardiology followup.  She has been seen by Dr. Eden Emms in the past and is seen by me for the first time today.  I have reviewed all her old records.  She was noted to have a VSD in childhood.  This was not seen on her recent echoes, suspect VSD has closed.  In 3/15, she came to the ER with a late presentation anterior MI.  She had Promus DES to the proximal LAD complicated by some distal embolization.  Echo in 4/15 showed EF 35-40%, but echo in 11/15 showed EF back up to 60-65%.  She continues to smoke.  She failed Chantix.    Main complaint today is dyspnea with exertion.  She is short of breath walking 20-30 feet if she rushes. Dyspnea walking up stairs.  She feels like the dyspnea began after she started lisinopril.  She also developed a dry cough.  She has been lightheaded with standing since starting lisinopril as well.  She has not had much chest pain.  She had one episode about 2 wks ago at rest. No claudication.   Labs (11/15): K 4.3, creatinine 1.01, LDL 65, HDL 39  PMH: 1. Active smoker 2. Hyperlipidemia 3. GERD 4. HTN 5. CAD: Late presentation anterior MI in 3/15.  LHC showed totally occluded proximal LAD with 40% LCx stenosis.  EF 45-50% by LV-gram.  She had Promus DES to proximal LAD with some distal embolization.   6. History of VSD from childhood.  No VSD noted on recent echoes.  7. Ischemic cardiomyopathy: Echo (4/15) with EF 35-40%.  Echo (11/15) with EF up to 60-65%.    SH: Married, lives in [place], active smoker.   FH: CAD  ROS: All systems reviewed and negative except as per HPI.   Current Outpatient Prescriptions  Medication Sig Dispense Refill  . alendronate (FOSAMAX) 10 MG tablet Take 1 tablet (10 mg total) by mouth daily before breakfast. Take with a full glass of water on  an empty stomach. 90 tablet 3  . ascorbic acid (VITAMIN C) 1000 MG tablet Take 1,000 mg by mouth daily.    Marland Kitchen aspirin 81 MG tablet Take 81 mg by mouth daily.    Marland Kitchen atorvastatin (LIPITOR) 40 MG tablet TAKE 1 TABLET DAILY AT 6PM 90 tablet 3  . Cholecalciferol (VITAMIN D PO) Take 1 tablet by mouth daily.     . clopidogrel (PLAVIX) 75 MG tablet Take 1 tablet (75 mg total) by mouth daily. 90 tablet 3  . furosemide (LASIX) 20 MG tablet Take 1 tablet (20 mg total) by mouth daily. 90 tablet 3  . Loratadine-Pseudoephedrine (CLARITIN-D 12 HOUR PO) Take 1 tablet by mouth daily.     Marland Kitchen losartan (COZAAR) 25 MG tablet 1/2 tablet (12.5mg ) daily 45 tablet 1  . metoprolol tartrate (LOPRESSOR) 25 MG tablet Take 1 tablet (25 mg total) by mouth 2 (two) times daily. 180 tablet 3  . nitroGLYCERIN (NITROSTAT) 0.4 MG SL tablet Place 1 tablet (0.4 mg total) under the tongue every 5 (five) minutes x 3 doses as needed for chest pain. 100 tablet 3  . pantoprazole (PROTONIX) 40 MG tablet Take 1 tablet (40 mg total) by mouth daily. 90 tablet 3  . potassium chloride SA (K-DUR,KLOR-CON) 20 MEQ tablet Take 1 tablet (20 mEq total) by  mouth 2 (two) times daily. 180 tablet 3   No current facility-administered medications for this visit.   BP 118/80 mmHg  Pulse 88  Ht 5\' 6"  (1.676 m)  Wt 160 lb (72.576 kg)  BMI 25.84 kg/m2  SpO2 98% General: NAD Neck: No JVD, no thyromegaly or thyroid nodule.  Lungs: Prolonged expiratory phase.  CV: Nondisplaced PMI.  Heart with somewhat distant sounds, regular S1/S2, no S3/S4, no murmur.  No peripheral edema.  No carotid bruit.  Unable to palpate pedal pulses.  Abdomen: Soft, nontender, no hepatosplenomegaly, no distention.  Skin: Intact without lesions or rashes.  Neurologic: Alert and oriented x 3.  Psych: Normal affect. Extremities: No clubbing or cyanosis.  HEENT: Normal.   Assessment/Plan:  1. Exertional dyspnea: This has worsened over the last couple of months.  She thinks it  started after lisinopril was begun.   - Stop lisinopril, she may be having upper airways symptoms due to the ACEI. I will have her instead start losartan 12.5 mg daily.  - I will arrange for ETT-Cardiolite to assess for ischemia.  - I will arrange for PFTs to assess for COPD given ongoing smoking.  2. Smoking: I strongly encouraged her to quit.  She has failed Chantix and patches.  I offered Wellbutrin.  She will think about it.   3. Hyperlipidemia: Continue current statin, good LDL in 11/15.  4. CAD: Plan as above for ETT-Cardiolite.  In the mean time, continue ASA 81, statin, Plavix, metoprolol.  Stopping ACEI and starting ARB.   5. PAD: Poor peripheral pulses, ongoing smoking.  I will arrange for peripheral arterial doppler evaluation.   Marca Ancona 12/20/2014

## 2014-12-20 NOTE — Patient Instructions (Signed)
Stop lisinopril.   Start losartan 12.5mg  daily. This will be 1/2 of a 25mg  tablet daily.  Your physician has requested that you have en exercise stress myoview. For further information please visit [email or web address] Please follow instruction sheet, as given.  Your physician has recommended that you have a pulmonary function test. Pulmonary Function Tests are a group of tests that measure how well air moves in and out of your lungs.  Your physician recommends that you schedule a follow-up appointment in: 3 months with Dr Shirlee Latch.

## 2014-12-28 ENCOUNTER — Telehealth: Payer: Self-pay | Admitting: Family Medicine

## 2014-12-28 ENCOUNTER — Encounter: Payer: Self-pay | Admitting: Family Medicine

## 2014-12-28 ENCOUNTER — Ambulatory Visit (INDEPENDENT_AMBULATORY_CARE_PROVIDER_SITE_OTHER): Payer: Commercial Managed Care - HMO | Admitting: Family Medicine

## 2014-12-28 VITALS — BP 120/74 | HR 94 | Temp 97.8°F | Ht 66.0 in | Wt 159.8 lb

## 2014-12-28 DIAGNOSIS — Z716 Tobacco abuse counseling: Secondary | ICD-10-CM

## 2014-12-28 DIAGNOSIS — F17200 Nicotine dependence, unspecified, uncomplicated: Secondary | ICD-10-CM

## 2014-12-28 DIAGNOSIS — Z720 Tobacco use: Secondary | ICD-10-CM | POA: Diagnosis not present

## 2014-12-28 DIAGNOSIS — H6122 Impacted cerumen, left ear: Secondary | ICD-10-CM

## 2014-12-28 NOTE — Progress Notes (Signed)
Pre visit review using our clinic review tool, if applicable. No additional management support is needed unless otherwise documented below in the visit note. 

## 2014-12-28 NOTE — Progress Notes (Signed)
HPI:  Cerumen impaction: -L ear -tried home kit -wants ear lavage -denies: pain in ear, drainage, fevers, worsened hearing  Noted Tobacco user: -understands risks -refuses to quit now -reports does not take chantix due to side effects -agrees to consider wellbutrin   ROS: See pertinent positives and negatives per HPI.  Past Medical History  Diagnosis Date  . Cholecystitis   . HYPERTENSION   . Allergic rhinitis due to other allergen   . GERD   . ELECTROCARDIOGRAM, ABNORMAL   . LOW BACK PAIN 01/02/2010    Qualifier: Diagnosis of  By: Ronnald Ramp MD, Arvid Right.   . Osteoporosis, dexa 2014, fosamax, cal, vit D and exercise advised 06/01/2013  . Murmur, hx VSD, evaluated by cardiology in 2013 06/03/2012  . CAD, multiple vessel, residual disease LCX 40% OM 01/22/2014  . STEMI (ST elevation myocardial infarction), 01/20/14, LAD PCI/Stent DES Promus preimer 01/20/2014  . Hyperlipidemia LDL goal < 70 01/22/2014  . RBBB, chronic 01/22/2014  . Tobacco use, stopped in 2013 01/22/2014  . LV dysfunction, EF at cath 45-50% 01/22/2014  . Hyperglycemia 01/22/2014    Past Surgical History  Procedure Laterality Date  . Laparoscopic cholecystectomy    . Abdominal hysterectomy  1983  . Coronary angioplasty with stent placement  01/20/2014    Promus preimer to LAD with STEMI  . Left heart cath N/A 01/20/2014    Procedure: LEFT HEART CATH;  Surgeon: Troy Sine, MD;  Location: [facility] CATH LAB;  Service: Cardiovascular;  Laterality: N/A;    Family History  Problem Relation Age of Onset  . Diabetes    . Hypertension      History   Social History  . Marital Status: Married    Spouse Name: N/A  . Number of Children: N/A  . Years of Education: N/A   Social History Main Topics  . Smoking status: Current Every Day Smoker -- 1.00 packs/day    Types: Cigarettes  . Smokeless tobacco: Not on file  . Alcohol Use: No  . Drug Use: No  . Sexual Activity: Not on file   Other Topics Concern  . None    Social History Narrative     Current outpatient prescriptions:  .  alendronate (FOSAMAX) 10 MG tablet, Take 1 tablet (10 mg total) by mouth daily before breakfast. Take with a full glass of water on an empty stomach., Disp: 90 tablet, Rfl: 3 .  ascorbic acid (VITAMIN C) 1000 MG tablet, Take 1,000 mg by mouth daily., Disp: , Rfl:  .  aspirin 81 MG tablet, Take 81 mg by mouth daily., Disp: , Rfl:  .  atorvastatin (LIPITOR) 40 MG tablet, TAKE 1 TABLET DAILY AT 6PM, Disp: 90 tablet, Rfl: 3 .  Cholecalciferol (VITAMIN D PO), Take 1 tablet by mouth daily. , Disp: , Rfl:  .  clopidogrel (PLAVIX) 75 MG tablet, Take 1 tablet (75 mg total) by mouth daily., Disp: 90 tablet, Rfl: 3 .  furosemide (LASIX) 20 MG tablet, Take 1 tablet (20 mg total) by mouth daily., Disp: 90 tablet, Rfl: 3 .  Loratadine-Pseudoephedrine (CLARITIN-D 12 HOUR PO), Take 1 tablet by mouth daily. , Disp: , Rfl:  .  losartan (COZAAR) 25 MG tablet, 1/2 tablet (12.22m) daily, Disp: 45 tablet, Rfl: 1 .  metoprolol tartrate (LOPRESSOR) 25 MG tablet, Take 1 tablet (25 mg total) by mouth 2 (two) times daily., Disp: 180 tablet, Rfl: 3 .  nitroGLYCERIN (NITROSTAT) 0.4 MG SL tablet, Place 1 tablet (0.4 mg total) under the  tongue every 5 (five) minutes x 3 doses as needed for chest pain., Disp: 100 tablet, Rfl: 3 .  pantoprazole (PROTONIX) 40 MG tablet, Take 1 tablet (40 mg total) by mouth daily., Disp: 90 tablet, Rfl: 3 .  potassium chloride SA (K-DUR,KLOR-CON) 20 MEQ tablet, Take 1 tablet (20 mEq total) by mouth 2 (two) times daily., Disp: 180 tablet, Rfl: 3  EXAM:  Filed Vitals:   12/28/14 1453  BP: 120/74  Pulse: 94  Temp: 97.8 F (36.6 C)    Body mass index is 25.8 kg/(m^2).  GENERAL: vitals reviewed and listed above, alert, oriented, appears well hydrated and in no acute distress  HEENT: atraumatic, conjunttiva clear, no obvious abnormalities on inspection of external nose and ears, cerumen impaction L - normal inspection  after removal  NECK: no obvious masses on inspection  LUNGS: clear to auscultation bilaterally, no wheezes, rales or rhonchi, good air movement  CV: HRRR, no peripheral edema  MS: moves all extremities without noticeable abnormality  PSYCH: pleasant and cooperative, no obvious depression or anxiety  ASSESSMENT AND PLAN:  Discussed the following assessment and plan:  Cerumen impaction, left -removed - lavage  Tobacco use disorder -counseled 3-5 minutes, advised to quit, offered wellbutrin - she will consider  -Patient advised to return or notify a doctor immediately if symptoms worsen or persist or new concerns arise.  Patient Instructions  Consider wellbutrin - I advise that you quit smoking     KIM, HANNAH R.

## 2014-12-28 NOTE — Patient Instructions (Signed)
Consider wellbutrin - I advise that you quit smoking

## 2014-12-28 NOTE — Telephone Encounter (Signed)
emmi emailed °

## 2015-01-16 ENCOUNTER — Ambulatory Visit (INDEPENDENT_AMBULATORY_CARE_PROVIDER_SITE_OTHER): Payer: Commercial Managed Care - HMO | Admitting: Internal Medicine

## 2015-01-16 DIAGNOSIS — R0602 Shortness of breath: Secondary | ICD-10-CM | POA: Diagnosis not present

## 2015-01-16 DIAGNOSIS — I2510 Atherosclerotic heart disease of native coronary artery without angina pectoris: Secondary | ICD-10-CM

## 2015-01-16 LAB — PULMONARY FUNCTION TEST
DL/VA % PRED: 71 %
DL/VA: 3.61 ml/min/mmHg/L
DLCO unc % pred: 63 %
DLCO unc: 17.23 ml/min/mmHg
FEF 25-75 Post: 1.9 L/sec
FEF 25-75 Pre: 1.55 L/sec
FEF2575-%Change-Post: 22 %
FEF2575-%PRED-PRE: 72 %
FEF2575-%Pred-Post: 89 %
FEV1-%CHANGE-POST: 5 %
FEV1-%PRED-POST: 83 %
FEV1-%Pred-Pre: 79 %
FEV1-POST: 2.13 L
FEV1-PRE: 2.01 L
FEV1FVC-%CHANGE-POST: 1 %
FEV1FVC-%Pred-Pre: 99 %
FEV6-%Change-Post: 3 %
FEV6-%PRED-PRE: 83 %
FEV6-%Pred-Post: 86 %
FEV6-PRE: 2.67 L
FEV6-Post: 2.77 L
FEV6FVC-%PRED-PRE: 104 %
FEV6FVC-%Pred-Post: 104 %
FVC-%CHANGE-POST: 3 %
FVC-%PRED-POST: 83 %
FVC-%Pred-Pre: 80 %
FVC-Post: 2.77 L
FVC-Pre: 2.67 L
PRE FEV6/FVC RATIO: 100 %
Post FEV1/FVC ratio: 77 %
Post FEV6/FVC ratio: 100 %
Pre FEV1/FVC ratio: 75 %
RV % pred: 106 %
RV: 2.39 L
TLC % PRED: 95 %
TLC: 5.11 L

## 2015-01-16 NOTE — Progress Notes (Signed)
PFT done today. 

## 2015-02-03 ENCOUNTER — Encounter: Payer: Self-pay | Admitting: Family Medicine

## 2015-02-05 ENCOUNTER — Other Ambulatory Visit: Payer: Self-pay | Admitting: *Deleted

## 2015-02-05 DIAGNOSIS — M810 Age-related osteoporosis without current pathological fracture: Secondary | ICD-10-CM

## 2015-02-05 MED ORDER — PANTOPRAZOLE SODIUM 40 MG PO TBEC: 40.0000 mg | DELAYED_RELEASE_TABLET | Freq: Every day | ORAL | Status: DC

## 2015-02-05 MED ORDER — ALENDRONATE SODIUM 10 MG PO TABS: 10.0000 mg | ORAL_TABLET | Freq: Every day | ORAL | Status: DC

## 2015-02-05 NOTE — Telephone Encounter (Signed)
Rxs done and pt informed via Mychart. 

## 2015-03-16 ENCOUNTER — Encounter: Payer: Self-pay | Admitting: *Deleted

## 2015-03-19 ENCOUNTER — Ambulatory Visit: Payer: Commercial Managed Care - HMO | Admitting: Cardiology

## 2015-05-19 ENCOUNTER — Other Ambulatory Visit: Payer: Self-pay | Admitting: Family Medicine

## 2015-05-19 ENCOUNTER — Other Ambulatory Visit: Payer: Self-pay | Admitting: Cardiology

## 2015-05-22 ENCOUNTER — Other Ambulatory Visit: Payer: Self-pay

## 2015-05-22 DIAGNOSIS — I2510 Atherosclerotic heart disease of native coronary artery without angina pectoris: Secondary | ICD-10-CM

## 2015-05-22 DIAGNOSIS — R0602 Shortness of breath: Secondary | ICD-10-CM

## 2015-05-22 MED ORDER — LOSARTAN POTASSIUM 25 MG PO TABS: ORAL_TABLET | ORAL | Status: DC

## 2015-06-27 ENCOUNTER — Encounter: Payer: Self-pay | Admitting: Cardiology

## 2015-06-27 ENCOUNTER — Ambulatory Visit (INDEPENDENT_AMBULATORY_CARE_PROVIDER_SITE_OTHER): Payer: Commercial Managed Care - HMO | Admitting: Cardiology

## 2015-06-27 VITALS — BP 102/70 | HR 78 | Ht 67.0 in | Wt 159.0 lb

## 2015-06-27 DIAGNOSIS — R011 Cardiac murmur, unspecified: Secondary | ICD-10-CM | POA: Diagnosis not present

## 2015-06-27 DIAGNOSIS — I2510 Atherosclerotic heart disease of native coronary artery without angina pectoris: Secondary | ICD-10-CM | POA: Diagnosis not present

## 2015-06-27 LAB — CBC WITH DIFFERENTIAL/PLATELET
BASOS ABS: 0.1 10*3/uL (ref 0.0–0.1)
Basophils Relative: 0.6 % (ref 0.0–3.0)
EOS ABS: 0.9 10*3/uL — AB (ref 0.0–0.7)
Eosinophils Relative: 9.8 % — ABNORMAL HIGH (ref 0.0–5.0)
HEMATOCRIT: 40.9 % (ref 36.0–46.0)
HEMOGLOBIN: 14 g/dL (ref 12.0–15.0)
LYMPHS PCT: 27.8 % (ref 12.0–46.0)
Lymphs Abs: 2.5 10*3/uL (ref 0.7–4.0)
MCHC: 34.2 g/dL (ref 30.0–36.0)
MCV: 89.9 fl (ref 78.0–100.0)
MONOS PCT: 7.7 % (ref 3.0–12.0)
Monocytes Absolute: 0.7 10*3/uL (ref 0.1–1.0)
NEUTROS ABS: 4.9 10*3/uL (ref 1.4–7.7)
Neutrophils Relative %: 54.1 % (ref 43.0–77.0)
PLATELETS: 378 10*3/uL (ref 150.0–400.0)
RBC: 4.55 Mil/uL (ref 3.87–5.11)
RDW: 13.3 % (ref 11.5–15.5)
WBC: 9.1 10*3/uL (ref 4.0–10.5)

## 2015-06-27 LAB — BASIC METABOLIC PANEL
BUN: 16 mg/dL (ref 6–23)
CALCIUM: 9.6 mg/dL (ref 8.4–10.5)
CO2: 29 meq/L (ref 19–32)
Chloride: 100 mEq/L (ref 96–112)
Creatinine, Ser: 0.92 mg/dL (ref 0.40–1.20)
GFR: 64.52 mL/min (ref >60.00)
Glucose, Bld: 104 mg/dL — ABNORMAL HIGH (ref 70–99)
Potassium: 3.9 mEq/L (ref 3.5–5.1)
SODIUM: 136 meq/L (ref 135–145)

## 2015-06-27 LAB — LIPID PANEL
CHOL/HDL RATIO: 3
Cholesterol: 127 mg/dL (ref 0–200)
HDL: 42.9 mg/dL (ref >39.00)
LDL Cholesterol: 59 mg/dL (ref 0–99)
NONHDL: 84.52
Triglycerides: 127 mg/dL (ref 0.0–149.0)
VLDL: 25.4 mg/dL (ref 0.0–40.0)

## 2015-06-27 MED ORDER — BUPROPION HCL ER (SR) 150 MG PO TB12: ORAL_TABLET | ORAL | Status: DC

## 2015-06-27 NOTE — Patient Instructions (Addendum)
Medication Instructions:  Start Wellbutrin to help you stop smoking.  You should take wellbutrin 150mg  daily for three days, then increase to 150mg  two times a day. You can take this for a total of 3 months.  Labwork Lipid profile/BMET/CBCd today.  Testing/Procedures: None today  Follow-Up: Your physician wants you to follow-up in: 6 months with Dr Shirlee Latch. (February 2017) You will receive a reminder letter in the mail two months in advance. If you don't receive a letter, please call our office to schedule the follow-up appointment.

## 2015-06-28 NOTE — Progress Notes (Signed)
Patient ID: Melissa Nixon, female   DOB: October 24, 1946, 69 y.o.   MRN: [id number] PCP: Dr. Selena Batten  69 yo with history of active smoking, CAD s/p anterior MI, and ischemic cardiomyopathy presents for cardiology followup. She was noted to have a VSD in childhood.  This was not seen on her recent echoes, suspect VSD has closed.  In 3/15, she came to the ER with a late presentation anterior MI.  She had Promus DES to the proximal LAD complicated by some distal embolization.  Echo in 4/15 showed EF 35-40%, but echo in 11/15 showed EF back up to 60-65%.  She continues to smoke.  She failed Chantix.  PFTs in 3/16 showed minimal obstructive airways disease.   At last appointment, she reported significant exertional dyspnea.  I ordered a Cardiolite but she cancelled it.  I also changed her lisinopril to losartan. She seems to be doing better now.  No dyspnea walking up steps or on flat ground.  No orthopnea/PND.  No chest pain. No claudication.   Labs (11/15): K 4.3, creatinine 1.01, LDL 65, HDL 39  ECG: NSR, RBBB, LAFB  PMH: 1. Active smoker: PFTs (3/16) with minimal obstructive airways disease.  2. Hyperlipidemia 3. GERD 4. HTN 5. CAD: Late presentation anterior MI in 3/15.  LHC showed totally occluded proximal LAD with 40% LCx stenosis.  EF 45-50% by LV-gram.  She had Promus DES to proximal LAD with some distal embolization.   6. History of VSD from childhood.  No VSD noted on recent echoes.  7. Ischemic cardiomyopathy: Echo (4/15) with EF 35-40%.  Echo (11/15) with EF up to 60-65%.    SH: Married, lives in [place], active smoker.   FH: CAD  ROS: All systems reviewed and negative except as per HPI.   Current Outpatient Prescriptions  Medication Sig Dispense Refill  . alendronate (FOSAMAX) 10 MG tablet TAKE 1 TABLET DAILY BEFORE BREAKFAST. TAKE WITH A FULL GLASS OF WATER ON AN EMPTY STOMACH. 90 tablet 0  . ascorbic acid (VITAMIN C) 1000 MG tablet Take 1,000 mg by mouth daily.    Marland Kitchen aspirin 81 MG  tablet Take 81 mg by mouth daily.    Marland Kitchen atorvastatin (LIPITOR) 40 MG tablet Take 40 mg by mouth daily.     . Cholecalciferol (VITAMIN D PO) Take 1 tablet by mouth daily.     . clopidogrel (PLAVIX) 75 MG tablet Take 1 tablet (75 mg total) by mouth daily. 90 tablet 3  . furosemide (LASIX) 20 MG tablet Take 1 tablet (20 mg total) by mouth daily. 90 tablet 3  . Loratadine-Pseudoephedrine (CLARITIN-D 12 HOUR PO) Take 1 tablet by mouth daily.     Marland Kitchen losartan (COZAAR) 25 MG tablet Take 12.5 mg by mouth daily. Take 1/2 tablet by mouth daily    . metoprolol tartrate (LOPRESSOR) 25 MG tablet Take 1 tablet (25 mg total) by mouth 2 (two) times daily. 180 tablet 3  . nitroGLYCERIN (NITROSTAT) 0.4 MG SL tablet Place 1 tablet (0.4 mg total) under the tongue every 5 (five) minutes x 3 doses as needed for chest pain. 100 tablet 3  . pantoprazole (PROTONIX) 40 MG tablet Take 40 mg by mouth daily.    . potassium chloride SA (K-DUR,KLOR-CON) 20 MEQ tablet Take 1 tablet (20 mEq total) by mouth 2 (two) times daily. 180 tablet 3  . buPROPion (WELLBUTRIN SR) 150 MG 12 hr tablet 1 tablet by mouth daily for 3 days then 1 tablet by mouth two times a  day 60 tablet 2   No current facility-administered medications for this visit.   BP 102/70 mmHg  Pulse 78  Ht 5\' 7"  (1.702 m)  Wt 159 lb (72.122 kg)  BMI 24.90 kg/m2 General: NAD Neck: No JVD, no thyromegaly or thyroid nodule.  Lungs: Prolonged expiratory phase.  CV: Nondisplaced PMI.  Heart with somewhat distant sounds, regular S1/S2, no S3/S4, no murmur.  No peripheral edema.  No carotid bruit.  Unable to palpate pedal pulses.  Abdomen: Soft, nontender, no hepatosplenomegaly, no distention.  Skin: Intact without lesions or rashes.  Neurologic: Alert and oriented x 3.  Psych: Normal affect. Extremities: No clubbing or cyanosis.  HEENT: Normal.   Assessment/Plan:  1. Exertional dyspnea: This seems to have largely resolved.  She is not volume overloaded on exam.   2.  Smoking: I strongly encouraged her to quit.  She has failed Chantix and patches.  I gave her a prescription for Wellbutrin.  3. Hyperlipidemia: Continue current statin, check lipids today.  4. CAD: Stable, continue ASA 81, statin, Plavix, metoprolol.  DAPT score = 4, so will continue Plavix long-term as long as there are no bleeding events.    Marca Ancona 06/28/2015

## 2015-07-16 ENCOUNTER — Other Ambulatory Visit: Payer: Self-pay

## 2015-07-16 DIAGNOSIS — Z1231 Encounter for screening mammogram for malignant neoplasm of breast: Secondary | ICD-10-CM

## 2015-07-24 ENCOUNTER — Ambulatory Visit
Admission: RE | Admit: 2015-07-24 | Discharge: 2015-07-24 | Disposition: A | Discharge status: Home / Self Care | Payer: Commercial Managed Care - HMO | Source: Ambulatory Visit

## 2015-07-24 DIAGNOSIS — Z1231 Encounter for screening mammogram for malignant neoplasm of breast: Secondary | ICD-10-CM | POA: Diagnosis not present

## 2015-08-21 ENCOUNTER — Ambulatory Visit (INDEPENDENT_AMBULATORY_CARE_PROVIDER_SITE_OTHER): Payer: Commercial Managed Care - HMO | Admitting: Family Medicine

## 2015-08-21 ENCOUNTER — Encounter: Payer: Self-pay | Admitting: Family Medicine

## 2015-08-21 VITALS — BP 120/82 | HR 96 | Temp 98.1°F | Ht 65.75 in | Wt 163.3 lb

## 2015-08-21 DIAGNOSIS — Z23 Encounter for immunization: Secondary | ICD-10-CM | POA: Diagnosis not present

## 2015-08-21 DIAGNOSIS — I2102 ST elevation (STEMI) myocardial infarction involving left anterior descending coronary artery: Secondary | ICD-10-CM

## 2015-08-21 DIAGNOSIS — E785 Hyperlipidemia, unspecified: Secondary | ICD-10-CM

## 2015-08-21 DIAGNOSIS — K219 Gastro-esophageal reflux disease without esophagitis: Secondary | ICD-10-CM

## 2015-08-21 DIAGNOSIS — M810 Age-related osteoporosis without current pathological fracture: Secondary | ICD-10-CM

## 2015-08-21 DIAGNOSIS — I5032 Chronic diastolic (congestive) heart failure: Secondary | ICD-10-CM

## 2015-08-21 DIAGNOSIS — Z0000 Encounter for general adult medical examination without abnormal findings: Secondary | ICD-10-CM | POA: Diagnosis not present

## 2015-08-21 DIAGNOSIS — Z720 Tobacco use: Secondary | ICD-10-CM

## 2015-08-21 DIAGNOSIS — I2510 Atherosclerotic heart disease of native coronary artery without angina pectoris: Secondary | ICD-10-CM

## 2015-08-21 DIAGNOSIS — I10 Essential (primary) hypertension: Secondary | ICD-10-CM

## 2015-08-21 MED ORDER — ALENDRONATE SODIUM 10 MG PO TABS: ORAL_TABLET | ORAL | Status: DC

## 2015-08-21 MED ORDER — PANTOPRAZOLE SODIUM 40 MG PO TBEC: 40.0000 mg | DELAYED_RELEASE_TABLET | Freq: Every day | ORAL | Status: DC

## 2015-08-21 NOTE — Patient Instructions (Addendum)
BEFORE YOU LEAVE: -flu and pneumococcal 23 vaccines -schedule follow up in 3 months  Start Wellbutrin as planned on Nov 1st and quit smoking 1 week later. YOU CAN do this!  Complete the stool cards and return these as instructed.  Please let us know if you change you mind about doing the lung cancer screening.  We recommend the following healthy lifestyle measures: - eat a healthy whole foods diet consisting of regular small meals composed of vegetables, fruits, beans, nuts, seeds, healthy meats such as white chicken and fish and whole grains.  - avoid sweets, white starchy foods, fried foods, fast food, processed foods, sodas, red meet and other fattening foods.  - get a least 150-300 minutes of aerobic exercise per week.   Please see a lawyer and/or go to this website to help you with advanced directives and a living will and designating a health care power of attorney so that your wishes will be followed should you become too ill to make your own medical decisions. [email or web address]

## 2015-08-21 NOTE — Progress Notes (Signed)
Pre visit review using our clinic review tool, if applicable. No additional management support is needed unless otherwise documented below in the visit note. 

## 2015-08-21 NOTE — Progress Notes (Signed)
Medicare Annual Preventive Care Visit  (initial annual wellness or annual wellness exam)  Concerns and/or follow up today:  Hx STEMI/CAD/HTN/HLD: -DES to LAD 12/2013, echo 11/15 with EF 60-65 % per cards notes -followed  Dr. Shirlee Latch, reviewed last note and appreciate care -on asa, plavix, lasix, statin, BB, arb -reports: walking 3 days per week; working on diet and lost weight intentionally -denies: CP, palpations, swelling, DOE, SOB, weight gain -labs done 06/27/15 per review of chart - other then mildly elevated blood glu - labs looked good  GERD: -taking pantoprazole for this, reports stable -denies: dysphagia, frequent symptoms -wants refill  Tobacco Use: -3-4 cigerettes per day -declined lung cancer screening: again refuses today -did not like chantix -counseled many times, wellbutrin provided by me and her cardiologist in the past -reports she is going to start wellbutrin nov 1st and quit - tough because husband smokes  Allergic Rhinitis: -stable -no wheezing, SOB  Prediabetes: -have advised healthy low carb diet and exercise -denies: polyuria, polydipsia, vision changes  Osteoporosis: -on fosamax, needs refill  ROS: negative for report of fevers, unintentional weight loss, vision changes, vision loss, hearing loss or change, chest pain, sob, hemoptysis, melena, hematochezia, hematuria, genital discharge or lesions, falls, bleeding or bruising, loc, thoughts of suicide or self harm, memory loss  1.) Patient-completed health risk assessment  - completed and reviewed, see scanned documentation  2.) Review of Medical History: -PMH, PSH, Family History and current specialty and care providers reviewed and updated and listed below  - see scanned in document in chart and below  Past Medical History  Diagnosis Date  . Cholecystitis   . HYPERTENSION   . Allergic rhinitis due to other allergen   . GERD   . ELECTROCARDIOGRAM, ABNORMAL   . LOW BACK PAIN 01/02/2010     Qualifier: Diagnosis of  By: Yetta Barre MD, Bernadene Bell.   . Osteoporosis, dexa 2014, fosamax, cal, vit D and exercise advised 06/01/2013  . Murmur, hx VSD, evaluated by cardiology in 2013 06/03/2012  . CAD, multiple vessel, residual disease LCX 40% OM 01/22/2014  . STEMI (ST elevation myocardial infarction), 01/20/14, LAD PCI/Stent DES Promus preimer 01/20/2014  . Hyperlipidemia LDL goal < 70 01/22/2014  . RBBB, chronic 01/22/2014  . Tobacco use, stopped in 2013 01/22/2014  . LV dysfunction, EF at cath 45-50% 01/22/2014  . Hyperglycemia 01/22/2014    Past Surgical History  Procedure Laterality Date  . Laparoscopic cholecystectomy    . Abdominal hysterectomy  1983  . Coronary angioplasty with stent placement  01/20/2014    Promus preimer to LAD with STEMI  . Left heart cath N/A 01/20/2014    Procedure: LEFT HEART CATH;  Surgeon: Lennette Bihari, MD;  Location: [facility] CATH LAB;  Service: Cardiovascular;  Laterality: N/A;    Social History   Social History  . Marital Status: Married    Spouse Name: N/A  . Number of Children: N/A  . Years of Education: N/A   Occupational History  . Not on file.   Social History Main Topics  . Smoking status: Current Every Day Smoker -- 1.00 packs/day    Types: Cigarettes  . Smokeless tobacco: Not on file  . Alcohol Use: No  . Drug Use: No  . Sexual Activity: Not on file   Other Topics Concern  . Not on file   Social History Narrative    The patient has a family history of  3.) Review of functional ability and level of safety:  Any difficulty  hearing? see scanned doc  History of falling? See scanned doc  Any trouble with IADLs - using a phone, using transportation, grocery shopping, preparing meals, doing housework, doing laundry, taking medications and managing money? See scanned doc  Advance Directives? See scanned doc.  See summary of recommendations in Patient Instructions below.  4.) Physical Exam Filed Vitals:   08/21/15 1506  BP: 120/82   Pulse: 96  Temp: 98.1 F (36.7 C)   Estimated body mass index is 26.56 kg/(m^2) as calculated from the following:   Height as of this encounter: 5' 5.75" (1.67 m).   Weight as of this encounter: 163 lb 4.8 oz (74.072 kg).  EKG (optional): deferred  General: alert, appear well hydrated and in no acute distress  HEENT: visual acuity grossly intact  CV: HRRR, no peripheral edema  Lungs: CTA bilaterally  Psych: pleasant and cooperative, no obvious depression or anxiety  Normal gross cog function  See patient instructions for recommendations.  Education and counseling regarding the above review of health provided with a plan for the following: -see scanned patient completed form for further details -fall prevention strategies discussed  -healthy lifestyle discussed -importance and resources for completing advanced directives discussed -see patient instructions below for any other recommendations provided  4)The following written screening schedule of preventive measures were reviewed with assessment and plan made per below, orders and patient instructions:       Alcohol screening done     Obesity Screening and counseling done     STI screening (Hep C if born 1944-65) done/declined     Tobacco Screening done for 5 minutes, advised to quit, offered help, she plans to start wellbutrin and try to quit       Pneumococcal (PPSV23 -one dose after 64, one before if risk factors), influenza yearly and hepatitis B vaccines (if high risk - end stage renal disease, IV drugs, homosexual men, live in home for mentally retarded, hemophilia receiving factors) ASSESSMENT/PLAN: today      Screening mammograph (yearly if >40) ASSESSMENT/PLAN: done      Screening Pap smear/pelvic exam (q2 years) ASSESSMENT/PLAN: done      Prostate cancer screening ASSESSMENT/PLAN: n/a      Colorectal cancer screening (FOBT yearly or flex sig q4y or colonoscopy q10y or barium enema q4y) ASSESSMENT/PLAN:  agreed to stool cards, refused colonoscopy, cards provided      Diabetes outpatient self-management training services ASSESSMENT/PLAN: n/a      Bone mass measurements(covered q2y if indicated - estrogen def, osteoporosis, hyperparathyroid, vertebral abnormalities, osteoporosis or steroids) ASSESSMENT/PLAN: done      Screening for glaucoma(q1y if high risk - diabetes, FH, AA and > 50 or hispanic and > 65) ASSESSMENT/PLAN: n/a      Medical nutritional therapy for individuals with diabetes or renal disease ASSESSMENT/PLAN: n/a      Cardiovascular screening blood tests (lipids q5y) ASSESSMENT/PLAN: done       Diabetes screening tests ASSESSMENT/PLAN: done   7.) Summary: -risk factors and conditions per above assessment were discussed and treatment, recommendations and referrals were offered per documentation above and orders and patient instructions.  Medicare annual wellness visit, subsequent  Encounter for immunization  Need for prophylactic vaccination against Streptococcus pneumoniae (pneumococcus) - Plan: Pneumococcal polysaccharide vaccine 23-valent greater than or equal to 2yo subcutaneous/IM  Essential hypertension  Gastroesophageal reflux disease, esophagitis presence not specified  Osteoporosis  ST elevation myocardial infarction involving left anterior descending (LAD) coronary artery (HCC)  CAD, multiple vessel, residual disease LCX 40% OM  Hyperlipidemia with target LDL less than 70  Tobacco use, stopped in 2013  Chronic diastolic congestive heart failure (HCC)  -discussed healthy lifestyle, CV risks, smoking risks and options for cessation at length -reviewed labs done iwht cardiology -refilled medications  Patient Instructions  BEFORE YOU LEAVE: -flu and pneumococcal 23 vaccines -schedule follow up in 3 months  Start Wellbutrin as planned on Nov 1st and quit smoking 1 week later. YOU CAN do this!  Complete the stool cards and return these as  instructed.  Please let us know if you change you mind about doing the lung cancer screening.  We recommend the following healthy lifestyle measures: - eat a healthy whole foods diet consisting of regular small meals composed of vegetables, fruits, beans, nuts, seeds, healthy meats such as white chicken and fish and whole grains.  - avoid sweets, white starchy foods, fried foods, fast food, processed foods, sodas, red meet and other fattening foods.  - get a least 150-300 minutes of aerobic exercise per week.   Please see a lawyer and/or go to this website to help you with advanced directives and a living will and designating a health care power of attorney so that your wishes will be followed should you become too ill to make your own medical decisions. [email or web address]

## 2015-08-22 ENCOUNTER — Telehealth: Payer: Self-pay | Admitting: Family Medicine

## 2015-08-22 NOTE — Telephone Encounter (Signed)
Pt needs generic protonix and fosamax 90 day supply w/refills  send to [facility] mail order not local pharm

## 2015-08-22 NOTE — Telephone Encounter (Signed)
[facility] - Client TELEPHONE ADVICE RECORD   [facility]    --------------------------------------------------------------------------------   Patient Name: Melissa Nixon  Gender: Female  DOB: 20-Nov-1945   Age: 69 Y 2 M 3 D  Return Phone Number: [phone] (Primary)  [place]     [place]  [address]   Client [facility] - Client  Client Site [facility] - Day  Physician Kriste Basque   Contact Type Call  Call Type Triage / Clinical  Relationship To Patient Self  Return Phone Number [phone] (Primary)  Chief Complaint Pain - Generalized  Initial Comment Caller states got flu shot and aching all over. Where she had the shot its very warm to touch.   PreDisposition Home Care       Nurse Assessment  Nurse: Logan Bores, RN, Melissa Date/Time Lamount Cohen Time): 08/22/2015 4:24:33 PM  Confirm and document reason for call. If symptomatic, describe symptoms. ---Caller states got flu shot and aching all over. Where she had the shot its very warm to touch.    Has the patient traveled out of the country within the last 30 days? ---Not Applicable    Does the patient have any new or worsening symptoms? ---Yes    Will a triage be completed? ---Yes    Related visit to physician within the last 2 weeks? ---Yes    Does the PT have any chronic conditions? (i.e. diabetes, asthma, etc.) ---Yes    List chronic conditions. ---heart attack 2015, hypertension, GERD, Osteoporosis           Guidelines          Guideline Title Affirmed Question Affirmed Notes Nurse Date/Time (Eastern Time)  Immunization Reactions H1N1 Influenza (inactivated) injected vaccine reactions (all triage questions negative)    Logan Bores, RN, Efraim Kaufmann 08/22/2015 4:28:34 PM    Disp. Time Lamount Cohen Time) Disposition Final User         08/22/2015 4:32:08 PM Home Care Yes Logan Bores, RN, Efraim Kaufmann            Caller  Understands: Yes  Disagree/Comply: Comply       Care Advice Given Per Guideline        HOME CARE: You should be able to treat this at home. NOTE TO TRIAGER: * Discuss the COMMON REACTIONS with the caller. Reassure the caller that these reactions are generally harmless. * Local pain, redness, swelling at injection site * Muscle aches, headache, nausea * Fever * If these symptoms occur, they usually last 1-2 days. H1N1 INFLUENZA VIRUS VACCINE (inactivated; injected) - COMMON REACTIONS: YOU CANNOT GET THE FLU FROM THE VACCINE: Since the virus in the vaccine has been killed, you cannot get influenza from the vaccine. COLD PACK FOR LOCAL REACTION AT INJECTION SITE: * Apply a cold pack or ice in a wet washcloth to the area for 20 minutes. Repeat in 1 hour. PAIN MEDICINES: ACETAMINOPHEN (E.G., TYLENOL): IBUPROFEN (E.G., MOTRIN, ADVIL): NAPROXEN (E.G., ALEVE): CALL BACK IF: * Fever lasts over 3 days * Pain lasts over 3 days * You become worse. * Redness or swelling lasts over 3 days    After Care Instructions Given        Call Event Type User Date / Time Description        --------------------------------------------------------------------------------

## 2015-08-23 MED ORDER — ALENDRONATE SODIUM 10 MG PO TABS: ORAL_TABLET | ORAL | Status: DC

## 2015-08-23 MED ORDER — PANTOPRAZOLE SODIUM 40 MG PO TBEC: 40.0000 mg | DELAYED_RELEASE_TABLET | Freq: Every day | ORAL | Status: DC

## 2015-08-23 NOTE — Telephone Encounter (Signed)
I left a message for the pt to return my call. 

## 2015-08-23 NOTE — Telephone Encounter (Signed)
Dr Selena Batten reviewed the message and stated this all sounds normal and very common;however if she is having redness of the entire arm she should be seen.  I called the pt and she stated she is feeling a lot better today, does not have a fever and no longer has body aches.  States she will call back if needed.

## 2015-08-23 NOTE — Telephone Encounter (Signed)
Rxs done. 

## 2015-11-10 ENCOUNTER — Other Ambulatory Visit: Payer: Self-pay | Admitting: Cardiology

## 2015-11-19 ENCOUNTER — Ambulatory Visit (INDEPENDENT_AMBULATORY_CARE_PROVIDER_SITE_OTHER): Payer: Commercial Managed Care - HMO | Admitting: Family Medicine

## 2015-11-19 ENCOUNTER — Encounter: Payer: Self-pay | Admitting: Family Medicine

## 2015-11-19 VITALS — BP 120/80 | HR 89 | Temp 98.5°F | Ht 65.75 in | Wt 163.1 lb

## 2015-11-19 DIAGNOSIS — R739 Hyperglycemia, unspecified: Secondary | ICD-10-CM

## 2015-11-19 DIAGNOSIS — F17200 Nicotine dependence, unspecified, uncomplicated: Secondary | ICD-10-CM

## 2015-11-19 DIAGNOSIS — E785 Hyperlipidemia, unspecified: Secondary | ICD-10-CM | POA: Diagnosis not present

## 2015-11-19 DIAGNOSIS — K219 Gastro-esophageal reflux disease without esophagitis: Secondary | ICD-10-CM

## 2015-11-19 DIAGNOSIS — I2102 ST elevation (STEMI) myocardial infarction involving left anterior descending coronary artery: Secondary | ICD-10-CM | POA: Diagnosis not present

## 2015-11-19 DIAGNOSIS — I2510 Atherosclerotic heart disease of native coronary artery without angina pectoris: Secondary | ICD-10-CM

## 2015-11-19 DIAGNOSIS — I10 Essential (primary) hypertension: Secondary | ICD-10-CM | POA: Diagnosis not present

## 2015-11-19 LAB — BASIC METABOLIC PANEL
BUN: 16 mg/dL (ref 6–23)
CALCIUM: 9.1 mg/dL (ref 8.4–10.5)
CO2: 27 meq/L (ref 19–32)
Chloride: 102 mEq/L (ref 96–112)
Creatinine, Ser: 0.86 mg/dL (ref 0.40–1.20)
GFR: 69.66 mL/min (ref >60.00)
Glucose, Bld: 104 mg/dL — ABNORMAL HIGH (ref 70–99)
Potassium: 4 mEq/L (ref 3.5–5.1)
SODIUM: 136 meq/L (ref 135–145)

## 2015-11-19 LAB — HEMOGLOBIN A1C: Hgb A1c MFr Bld: 6.2 % (ref 4.6–6.5)

## 2015-11-19 NOTE — Progress Notes (Signed)
Pre visit review using our clinic review tool, if applicable. No additional management support is needed unless otherwise documented below in the visit note. 

## 2015-11-19 NOTE — Patient Instructions (Signed)
BEFORE YOU LEAVE: -labs -Follow up in 4-5 months  We recommend the following healthy lifestyle measures: - eat a healthy whole foods diet consisting of regular small meals composed of vegetables, fruits, beans, nuts, seeds, healthy meats such as white chicken and fish and whole grains.  - avoid sweets, white starchy foods, fried foods, fast food, processed foods, sodas, red meet and other fattening foods.  - get a least 150-300 minutes of aerobic exercise per week.   -We have ordered labs or studies at this visit. It can take up to 1-2 weeks for results and processing. We will contact you with instructions IF your results are abnormal. Normal results will be released to your [facility]. If you have not heard from [place] or can not find your results in [facility] in 2 weeks please contact our office.  -Please quit smoking

## 2015-11-19 NOTE — Progress Notes (Signed)
HPI:  Hx STEMI/CAD/HTN/HLD: -DES to LAD 12/2013, echo 11/15 with EF 60-65 % per cards notes -followed Dr. Shirlee Latch, reviewed last note and appreciate care -on asa, plavix, lasix, statin, BB, arb -denies: CP, palpations, swelling, DOE, SOB, weight gain -labs done 06/27/15 per review of chart - other then mildly elevated blood glu - labs looked good  GERD: -taking pantoprazole for this, reports stable -denies: dysphagia, frequent symptoms -wants refill  Tobacco Use: -3-4 cigerettes per day -declined lung cancer screening -did not like chantix -counseled many times, wellbutrin provided by me and her cardiologist in the past -reports tough because husband smokes - husband continues to smoke despite poor health  Allergic Rhinitis: -stable -no wheezing, SOB  Prediabetes: -have advised healthy low carb diet and exercise -denies: polyuria, polydipsia, vision changes  Osteoporosis: -on fosamax   ROS: See pertinent positives and negatives per HPI.  Past Medical History  Diagnosis Date  . Cholecystitis   . HYPERTENSION   . Allergic rhinitis due to other allergen   . GERD   . ELECTROCARDIOGRAM, ABNORMAL   . LOW BACK PAIN 01/02/2010    Qualifier: Diagnosis of  By: Yetta Barre MD, Bernadene Bell.   . Osteoporosis, dexa 2014, fosamax, cal, vit D and exercise advised 06/01/2013  . Murmur, hx VSD, evaluated by cardiology in 2013 06/03/2012  . CAD, multiple vessel, residual disease LCX 40% OM 01/22/2014  . STEMI (ST elevation myocardial infarction), 01/20/14, LAD PCI/Stent DES Promus preimer 01/20/2014  . Hyperlipidemia LDL goal < 70 01/22/2014  . RBBB, chronic 01/22/2014  . Tobacco use, stopped in 2013 01/22/2014  . LV dysfunction, EF at cath 45-50% 01/22/2014  . Hyperglycemia 01/22/2014    Past Surgical History  Procedure Laterality Date  . Laparoscopic cholecystectomy    . Abdominal hysterectomy  1983  . Coronary angioplasty with stent placement  01/20/2014    Promus preimer to LAD with STEMI  .  Left heart cath N/A 01/20/2014    Procedure: LEFT HEART CATH;  Surgeon: Lennette Bihari, MD;  Location: [facility] CATH LAB;  Service: Cardiovascular;  Laterality: N/A;    Family History  Problem Relation Age of Onset  . Diabetes    . Hypertension      Social History   Social History  . Marital Status: Married    Spouse Name: N/A  . Number of Children: N/A  . Years of Education: N/A   Social History Main Topics  . Smoking status: Current Every Day Smoker -- 1.00 packs/day    Types: Cigarettes  . Smokeless tobacco: None  . Alcohol Use: No  . Drug Use: No  . Sexual Activity: Not Asked   Other Topics Concern  . None   Social History Narrative     Current outpatient prescriptions:  .  alendronate (FOSAMAX) 10 MG tablet, TAKE 1 TABLET DAILY BEFORE BREAKFAST. TAKE WITH A FULL GLASS OF WATER ON AN EMPTY STOMACH., Disp: 90 tablet, Rfl: 3 .  aspirin 81 MG tablet, Take 81 mg by mouth daily., Disp: , Rfl:  .  atorvastatin (LIPITOR) 40 MG tablet, TAKE 1 TABLET EVERY DAY  AT  6PM, Disp: 90 tablet, Rfl: 0 .  buPROPion (WELLBUTRIN SR) 150 MG 12 hr tablet, 1 tablet by mouth daily for 3 days then 1 tablet by mouth two times a day, Disp: 60 tablet, Rfl: 2 .  clopidogrel (PLAVIX) 75 MG tablet, TAKE 1 TABLET EVERY DAY, Disp: 90 tablet, Rfl: 0 .  furosemide (LASIX) 20 MG tablet, TAKE 1 TABLET EVERY  DAY, Disp: 90 tablet, Rfl: 0 .  Loratadine-Pseudoephedrine (CLARITIN-D 12 HOUR PO), Take 1 tablet by mouth daily. , Disp: , Rfl:  .  losartan (COZAAR) 25 MG tablet, TAKE 1/2 TABLET EVERY DAY, Disp: 45 tablet, Rfl: 0 .  metoprolol tartrate (LOPRESSOR) 25 MG tablet, TAKE 1 TABLET TWICE DAILY, Disp: 180 tablet, Rfl: 0 .  nitroGLYCERIN (NITROSTAT) 0.4 MG SL tablet, Place 1 tablet (0.4 mg total) under the tongue every 5 (five) minutes x 3 doses as needed for chest pain., Disp: 100 tablet, Rfl: 3 .  pantoprazole (PROTONIX) 40 MG tablet, Take 1 tablet (40 mg total) by mouth daily., Disp: 90 tablet, Rfl: 3 .   potassium chloride SA (K-DUR,KLOR-CON) 20 MEQ tablet, TAKE 1 TABLET TWICE DAILY, Disp: 180 tablet, Rfl: 0  EXAM:  Filed Vitals:   11/19/15 1425  BP: 120/80  Pulse: 89  Temp: 98.5 F (36.9 C)    Body mass index is 26.53 kg/(m^2).  GENERAL: vitals reviewed and listed above, alert, oriented, appears well hydrated and in no acute distress  HEENT: atraumatic, conjunttiva clear, no obvious abnormalities on inspection of external nose and ears  NECK: no obvious masses on inspection  LUNGS: clear to auscultation bilaterally, no wheezes, rales or rhonchi, good air movement  CV: HRRR, no peripheral edema  MS: moves all extremities without noticeable abnormality  PSYCH: pleasant and cooperative, no obvious depression or anxiety  ASSESSMENT AND PLAN:  Discussed the following assessment and plan:  Essential hypertension  ST elevation myocardial infarction involving left anterior descending (LAD) coronary artery (HCC)  CAD, multiple vessel, residual disease LCX 40% OM  Hyperlipidemia with target LDL less than 70  Hyperglycemia  Gastroesophageal reflux disease, esophagitis presence not specified  Tobacco use disorder  -BMP, Hgba1c -lifestyle recs -advised to quit smoking, again, offered help -Patient advised to return or notify a doctor immediately if symptoms worsen or persist or new concerns arise.  Patient Instructions  BEFORE YOU LEAVE: -labs -Follow up in 4-5 months  We recommend the following healthy lifestyle measures: - eat a healthy whole foods diet consisting of regular small meals composed of vegetables, fruits, beans, nuts, seeds, healthy meats such as white chicken and fish and whole grains.  - avoid sweets, white starchy foods, fried foods, fast food, processed foods, sodas, red meet and other fattening foods.  - get a least 150-300 minutes of aerobic exercise per week.   -We have ordered labs or studies at this visit. It can take up to 1-2 weeks for  results and processing. We will contact you with instructions IF your results are abnormal. Normal results will be released to your [facility]. If you have not heard from [place] or can not find your results in [facility] in 2 weeks please contact our office.  -Please quit smoking          Melissa Barretto R.

## 2016-03-15 ENCOUNTER — Other Ambulatory Visit: Payer: Self-pay | Admitting: Cardiology

## 2016-03-17 ENCOUNTER — Other Ambulatory Visit: Payer: Self-pay | Admitting: *Deleted

## 2016-03-17 MED ORDER — LOSARTAN POTASSIUM 25 MG PO TABS: 12.5000 mg | ORAL_TABLET | Freq: Every day | ORAL | Status: DC

## 2016-03-17 MED ORDER — CLOPIDOGREL BISULFATE 75 MG PO TABS: 75.0000 mg | ORAL_TABLET | Freq: Every day | ORAL | Status: DC

## 2016-03-17 MED ORDER — FUROSEMIDE 20 MG PO TABS: 20.0000 mg | ORAL_TABLET | Freq: Every day | ORAL | Status: DC

## 2016-03-17 MED ORDER — POTASSIUM CHLORIDE CRYS ER 20 MEQ PO TBCR: 20.0000 meq | EXTENDED_RELEASE_TABLET | Freq: Two times a day (BID) | ORAL | Status: DC

## 2016-03-17 MED ORDER — METOPROLOL TARTRATE 25 MG PO TABS: 25.0000 mg | ORAL_TABLET | Freq: Two times a day (BID) | ORAL | Status: DC

## 2016-03-17 MED ORDER — ATORVASTATIN CALCIUM 40 MG PO TABS: ORAL_TABLET | ORAL | Status: DC

## 2016-04-18 ENCOUNTER — Ambulatory Visit: Payer: Commercial Managed Care - HMO | Admitting: Family Medicine

## 2016-04-21 ENCOUNTER — Encounter: Payer: Self-pay | Admitting: Family Medicine

## 2016-04-21 ENCOUNTER — Ambulatory Visit (INDEPENDENT_AMBULATORY_CARE_PROVIDER_SITE_OTHER): Payer: Commercial Managed Care - HMO | Admitting: Family Medicine

## 2016-04-21 VITALS — BP 124/80 | HR 83 | Temp 98.2°F | Ht 65.75 in | Wt 170.8 lb

## 2016-04-21 DIAGNOSIS — M810 Age-related osteoporosis without current pathological fracture: Secondary | ICD-10-CM

## 2016-04-21 DIAGNOSIS — I10 Essential (primary) hypertension: Secondary | ICD-10-CM

## 2016-04-21 DIAGNOSIS — R739 Hyperglycemia, unspecified: Secondary | ICD-10-CM | POA: Diagnosis not present

## 2016-04-21 DIAGNOSIS — K219 Gastro-esophageal reflux disease without esophagitis: Secondary | ICD-10-CM

## 2016-04-21 DIAGNOSIS — F17200 Nicotine dependence, unspecified, uncomplicated: Secondary | ICD-10-CM

## 2016-04-21 DIAGNOSIS — I2510 Atherosclerotic heart disease of native coronary artery without angina pectoris: Secondary | ICD-10-CM | POA: Diagnosis not present

## 2016-04-21 DIAGNOSIS — E785 Hyperlipidemia, unspecified: Secondary | ICD-10-CM | POA: Diagnosis not present

## 2016-04-21 NOTE — Patient Instructions (Addendum)
BEFORE YOU LEAVE: -physical with Dr. Selena Batten - come fasting; Medicare exam with Darl Pikes in October  We recommend the following healthy lifestyle measures: - eat a healthy whole foods diet consisting of regular small meals composed of vegetables, fruits, beans, nuts, seeds, healthy meats such as white chicken and fish and whole grains.  - avoid sweets, white starchy foods, fried foods, fast food, processed foods, sodas, red meet and other fattening foods.  - get a least 150-300 minutes of aerobic exercise per week.   We will plan to labs at your physical with mee

## 2016-04-21 NOTE — Progress Notes (Signed)
Pre visit review using our clinic review tool, if applicable. No additional management support is needed unless otherwise documented below in the visit note. 

## 2016-04-21 NOTE — Progress Notes (Signed)
HPI:  Follow up:  Hx STEMI/CAD/HTN/HLD: -DES to LAD 12/2013, echo 11/15 with EF 60-65 % per cards notes -followedby Dr. Shirlee Latch -on asa, plavix, lasix, statin, BB, arb -denies: CP, palpations, swelling, DOE, SOB, weight gain  GERD: -taking pantoprazole for this, reports stable -denies: dysphagia, frequent symptoms -wants refill  Tobacco Use: -she quit in May 2017! -husband continues to smokes -declined lung cancer screening  Allergic Rhinitis: -stable -no wheezing, SOB  Prediabetes: -have advised healthy low carb diet and exercise - no regular exercise -denies: polyuria, polydipsia, vision changes  Osteoporosis: -on fosamax  ROS: See pertinent positives and negatives per HPI.  Past Medical History  Diagnosis Date  . Cholecystitis   . HYPERTENSION   . Allergic rhinitis due to other allergen   . GERD   . ELECTROCARDIOGRAM, ABNORMAL   . LOW BACK PAIN 01/02/2010    Qualifier: Diagnosis of  By: Yetta Barre MD, Bernadene Bell.   . Osteoporosis, dexa 2014, fosamax, cal, vit D and exercise advised 06/01/2013  . Murmur, hx VSD, evaluated by cardiology in 2013 06/03/2012  . CAD, multiple vessel, residual disease LCX 40% OM 01/22/2014  . STEMI (ST elevation myocardial infarction), 01/20/14, LAD PCI/Stent DES Promus preimer 01/20/2014  . Hyperlipidemia LDL goal < 70 01/22/2014  . RBBB, chronic 01/22/2014  . Tobacco use, stopped in 2013 01/22/2014  . LV dysfunction, EF at cath 45-50% 01/22/2014  . Hyperglycemia 01/22/2014    Past Surgical History  Procedure Laterality Date  . Laparoscopic cholecystectomy    . Abdominal hysterectomy  1983  . Coronary angioplasty with stent placement  01/20/2014    Promus preimer to LAD with STEMI  . Left heart cath N/A 01/20/2014    Procedure: LEFT HEART CATH;  Surgeon: Lennette Bihari, MD;  Location: [facility] CATH LAB;  Service: Cardiovascular;  Laterality: N/A;    Family History  Problem Relation Age of Onset  . Diabetes    . Hypertension      Social History    Social History  . Marital Status: Married    Spouse Name: N/A  . Number of Children: N/A  . Years of Education: N/A   Social History Main Topics  . Smoking status: Former Smoker -- 1.00 packs/day    Types: Cigarettes  . Smokeless tobacco: None     Comment: per patient-quit 02/2016  . Alcohol Use: No  . Drug Use: No  . Sexual Activity: Not Asked   Other Topics Concern  . None   Social History Narrative     Current outpatient prescriptions:  .  alendronate (FOSAMAX) 10 MG tablet, TAKE 1 TABLET DAILY BEFORE BREAKFAST. TAKE WITH A FULL GLASS OF WATER ON AN EMPTY STOMACH., Disp: 90 tablet, Rfl: 3 .  aspirin 81 MG tablet, Take 81 mg by mouth daily., Disp: , Rfl:  .  atorvastatin (LIPITOR) 40 MG tablet, TAKE 1 TABLET EVERY DAY  AT  6PM, Disp: 90 tablet, Rfl: 0 .  clopidogrel (PLAVIX) 75 MG tablet, Take 1 tablet (75 mg total) by mouth daily., Disp: 90 tablet, Rfl: 0 .  furosemide (LASIX) 20 MG tablet, Take 1 tablet (20 mg total) by mouth daily., Disp: 90 tablet, Rfl: 0 .  Loratadine-Pseudoephedrine (CLARITIN-D 12 HOUR PO), Take 1 tablet by mouth daily. , Disp: , Rfl:  .  losartan (COZAAR) 25 MG tablet, Take 0.5 tablets (12.5 mg total) by mouth daily., Disp: 45 tablet, Rfl: 0 .  metoprolol tartrate (LOPRESSOR) 25 MG tablet, Take 1 tablet (25 mg total) by mouth  2 (two) times daily., Disp: 180 tablet, Rfl: 0 .  nitroGLYCERIN (NITROSTAT) 0.4 MG SL tablet, Place 1 tablet (0.4 mg total) under the tongue every 5 (five) minutes x 3 doses as needed for chest pain., Disp: 100 tablet, Rfl: 3 .  pantoprazole (PROTONIX) 40 MG tablet, Take 1 tablet (40 mg total) by mouth daily., Disp: 90 tablet, Rfl: 3 .  potassium chloride SA (K-DUR,KLOR-CON) 20 MEQ tablet, Take 1 tablet (20 mEq total) by mouth 2 (two) times daily., Disp: 180 tablet, Rfl: 0  EXAM:  Filed Vitals:   04/21/16 1338  BP: 124/80  Pulse: 83  Temp: 98.2 F (36.8 C)    Body mass index is 27.78 kg/(m^2).  GENERAL: vitals reviewed  and listed above, alert, oriented, appears well hydrated and in no acute distress  HEENT: atraumatic, conjunttiva clear, no obvious abnormalities on inspection of external nose and ears  NECK: no obvious masses on inspection  LUNGS: clear to auscultation bilaterally, no wheezes, rales or rhonchi, good air movement  CV: HRRR, no peripheral edema  MS: moves all extremities without noticeable abnormality  PSYCH: pleasant and cooperative, no obvious depression or anxiety  ASSESSMENT AND PLAN:  Discussed the following assessment and plan:  Tobacco use disorder  CAD, multiple vessel, residual disease LCX 40% OM  Hyperlipidemia with target LDL less than 70  Hyperglycemia  Essential hypertension  Osteoporosis  Gastroesophageal reflux disease, esophagitis presence not specified  -congratulated on smoking cessation -lifestyle recommendations -Medicare exam with fasting labs October  -Patient advised to return or notify a doctor immediately if symptoms worsen or persist or new concerns arise.  Patient Instructions  BEFORE YOU LEAVE: -physical with Dr. Selena Batten - come fasting; Medicare exam with Darl Pikes in October  We recommend the following healthy lifestyle measures: - eat a healthy whole foods diet consisting of regular small meals composed of vegetables, fruits, beans, nuts, seeds, healthy meats such as white chicken and fish and whole grains.  - avoid sweets, white starchy foods, fried foods, fast food, processed foods, sodas, red meet and other fattening foods.  - get a least 150-300 minutes of aerobic exercise per week.   We will plan to labs at your physical with Kathryne Sharper, Damita Lack.

## 2016-06-04 DIAGNOSIS — H18222 Idiopathic corneal edema, left eye: Secondary | ICD-10-CM | POA: Diagnosis not present

## 2016-06-07 ENCOUNTER — Other Ambulatory Visit: Payer: Self-pay | Admitting: Cardiology

## 2016-06-10 ENCOUNTER — Other Ambulatory Visit: Payer: Self-pay | Admitting: Cardiology

## 2016-06-13 ENCOUNTER — Other Ambulatory Visit: Payer: Self-pay | Admitting: *Deleted

## 2016-06-13 MED ORDER — LOSARTAN POTASSIUM 25 MG PO TABS: 12.5000 mg | ORAL_TABLET | Freq: Every day | ORAL | 0 refills | Status: DC

## 2016-06-13 MED ORDER — FUROSEMIDE 20 MG PO TABS: 20.0000 mg | ORAL_TABLET | Freq: Every day | ORAL | 0 refills | Status: DC

## 2016-06-13 MED ORDER — POTASSIUM CHLORIDE CRYS ER 20 MEQ PO TBCR: 20.0000 meq | EXTENDED_RELEASE_TABLET | Freq: Two times a day (BID) | ORAL | 0 refills | Status: DC

## 2016-06-13 MED ORDER — METOPROLOL TARTRATE 25 MG PO TABS: 25.0000 mg | ORAL_TABLET | Freq: Two times a day (BID) | ORAL | 0 refills | Status: DC

## 2016-06-13 MED ORDER — ATORVASTATIN CALCIUM 40 MG PO TABS: 40.0000 mg | ORAL_TABLET | Freq: Every day | ORAL | 0 refills | Status: DC

## 2016-06-13 MED ORDER — CLOPIDOGREL BISULFATE 75 MG PO TABS: 75.0000 mg | ORAL_TABLET | Freq: Every day | ORAL | 0 refills | Status: DC

## 2016-08-03 ENCOUNTER — Other Ambulatory Visit: Payer: Self-pay | Admitting: Family Medicine

## 2016-08-14 ENCOUNTER — Encounter: Payer: Self-pay | Admitting: Cardiology

## 2016-08-20 NOTE — Progress Notes (Signed)
Medicare Annual Preventive Care Visit  (initial annual wellness or annual wellness exam)  Concerns and/or follow up today:  Hx STEMI/CAD/HTN/HLD: -DES to LAD 12/2013, echo 11/15 with EF 60-65 % per cards notes -followedby Dr. Aundra Dubin, seen 05/2015 - appreciate care; she reports she has an appointment with her cardiologist next week -on asa, plavix, lasix, statin, BB, arb, kdur -denies: CP, palpations, swelling, DOE, SOB, weight gain  GERD: -taking pantoprazole 27m for this, reports stable -27mdidn't seem to help -denies: dysphagia, frequent symptoms  Tobacco Use: -she quit in May 2017 -husband continues to smokes -again declines lung cancer screening   Allergic Rhinitis: -stable -no wheezing, SOB  Prediabetes/BMI 27: -have advised healthy low carb diet and exercise - see scanned documentation of current lifestyle - goes to the  To walk, feels diet is good -denies: polyuria, polydipsia, vision changes  Osteoporosis: -on fosamax -last bone density 08/2012  ROS: negative for report of fevers, unintentional weight loss, vision changes, vision loss, hearing loss or change, chest pain, sob, hemoptysis, melena, hematochezia, hematuria, genital discharge or lesions, falls, bleeding or bruising, loc, thoughts of suicide or self harm, memory loss  1.) Patient-completed health risk assessment  - completed and reviewed, see scanned documentation  2.) Review of Medical History: -PMH, PSH, Family History and current specialty and care providers reviewed and updated and listed below  - see scanned in document in chart and below  Past Medical History:  Diagnosis Date  . Allergic rhinitis due to other allergen   . CAD, multiple vessel, residual disease LCX 40% OM 01/22/2014  . Cholecystitis   . ELECTROCARDIOGRAM, ABNORMAL   . GERD   . Hyperglycemia 01/22/2014  . Hyperlipidemia LDL goal < 70 01/22/2014  . HYPERTENSION   . LOW BACK PAIN 01/02/2010   Qualifier: Diagnosis of  By:  JoRonnald RampD, ThArvid Right  . LV dysfunction, EF at cath 45-50% 01/22/2014  . Murmur, hx VSD, evaluated by cardiology in 2013 06/03/2012  . Osteoporosis, dexa 2014, fosamax, cal, vit D and exercise advised 06/01/2013  . RBBB, chronic 01/22/2014  . STEMI (ST elevation myocardial infarction), 01/20/14, LAD PCI/Stent DES Promus preimer 01/20/2014  . Tobacco use, stopped in 2013 01/22/2014    Past Surgical History:  Procedure Laterality Date  . ABDOMINAL HYSTERECTOMY  1983  . CORONARY ANGIOPLASTY WITH STENT PLACEMENT  01/20/2014   Promus preimer to LAD with STEMI  . LAPAROSCOPIC CHOLECYSTECTOMY    . LEFT HEART CATH N/A 01/20/2014   Procedure: LEFT HEART CATH;  Surgeon: ThTroy SineMD;  [facility];  Service: Cardiovascular;  Laterality: N/A;    Social History   Social History  . Marital status: Married    Spouse name: N/A  . Number of children: N/A  . Years of education: N/A   Occupational History  . Not on file.   Social History Main Topics  . Smoking status: Former Smoker    Packs/day: 1.00    Types: Cigarettes  . Smokeless tobacco: Never Used     Comment: per patient-quit 02/2016  . Alcohol use No  . Drug use: No  . Sexual activity: Not on file   Other Topics Concern  . Not on file   Social History Narrative  . No narrative on file    Family History  Problem Relation Age of Onset  . Diabetes    . Hypertension      Current Outpatient Prescriptions on File Prior to Visit  Medication Sig Dispense Refill  .  alendronate (FOSAMAX) 10 MG tablet TAKE 1 TABLET DAILY BEFORE BREAKFAST. TAKE WITH A FULL GLASS OF WATER ON AN EMPTY STOMACH. 90 tablet 1  . aspirin 81 MG tablet Take 81 mg by mouth daily.    Marland Kitchen atorvastatin (LIPITOR) 40 MG tablet Take 1 tablet (40 mg total) by mouth daily at 6 PM. 90 tablet 0  . clopidogrel (PLAVIX) 75 MG tablet Take 1 tablet (75 mg total) by mouth daily. 90 tablet 0  . furosemide (LASIX) 20 MG tablet Take 1 tablet (20 mg total) by mouth daily. 90  tablet 0  . Loratadine-Pseudoephedrine (CLARITIN-D 12 HOUR PO) Take 1 tablet by mouth daily.     Marland Kitchen losartan (COZAAR) 25 MG tablet Take 0.5 tablets (12.5 mg total) by mouth daily. 45 tablet 0  . metoprolol tartrate (LOPRESSOR) 25 MG tablet Take 1 tablet (25 mg total) by mouth 2 (two) times daily. 180 tablet 0  . nitroGLYCERIN (NITROSTAT) 0.4 MG SL tablet Place 1 tablet (0.4 mg total) under the tongue every 5 (five) minutes x 3 doses as needed for chest pain. 100 tablet 3  . potassium chloride SA (K-DUR,KLOR-CON) 20 MEQ tablet Take 1 tablet (20 mEq total) by mouth 2 (two) times daily. 180 tablet 0   No current facility-administered medications on file prior to visit.      3.) Review of functional ability and level of safety:  Any difficulty hearing? See scanned documentation, wears hearing aids.  History of falling? See scanned documentation  Any trouble with IADLs - using a phone, using transportation, grocery shopping, preparing meals, doing housework, doing laundry, taking medications and managing money? See scanned documentation  Advance Directives? See scanned documentation - declined in the past; she again declined more information or assistance with this, but agrees to think about it.  See summary of recommendations in Patient Instructions below.  4.) Physical Exam Vitals:   08/21/16 0936  BP: 124/76  Pulse: 86  Temp: 97.9 F (36.6 C)   Estimated body mass index is 27.94 kg/m as calculated from the following:   Height as of this encounter: 5' 5.75" (1.67 m).   Weight as of this encounter: 171 lb 12.8 oz (77.9 kg).  EKG (optional): deferred  General: alert, appear well hydrated and in no acute distress  HEENT: visual acuity grossly intact  CV: HRRR, no LE edema  Lungs: CTA bilaterally  Psych: pleasant and cooperative, no obvious depression or anxiety  Cognitive function grossly intact  See patient instructions for recommendations.  Education and counseling  regarding the above review of health provided with a plan for the following: -see scanned patient completed form for further details -fall prevention strategies discussed  -healthy lifestyle discussed -importance and resources for completing advanced directives discussed -see patient instructions below for any other recommendations provided  4)The following written screening schedule of preventive measures were reviewed with assessment and plan made per below, orders and patient instructions:         Alcohol screening - see scanned documentation     Obesity Screening and counseling done     STI screening (Hep C if born [birth year]) offered and per pt wishes     Tobacco Screening done - see scanned documentation       Pneumococcal (PPSV23 -one dose after 64, one before if risk factors), influenza yearly and hepatitis B vaccines (if high risk - end stage renal disease, IV drugs, homosexual men, live in home for mentally retarded, hemophilia receiving factors) ASSESSMENT/PLAN: done  Screening mammograph (yearly if >40) ASSESSMENT/PLAN: done 06/2015      Screening Pap smear/pelvic exam (q2 years) ASSESSMENT/PLAN: n/a, s/p abdominal hysterectomy      Colorectal cancer screening (FOBT yearly or flex sig q4y or colonoscopy q10y or barium enema q4y) ASSESSMENT/PLAN: refused all except stool cards at prior preventive visits, but did not complete stool cards that were given, advised of new option cologuard today - she agreed to do this and I advised Wendie Simmer to do this      Diabetes outpatient self-management training services ASSESSMENT/PLAN: utd or done      Bone mass measurements(covered q2y if indicated - estrogen def, osteoporosis, hyperparathyroid, vertebral abnormalities, osteoporosis or steroids) ASSESSMENT/PLAN: advised to do and offered to schedule; [facility] at [facility]      Screening for glaucoma(q1y if high risk - diabetes, FH, AA and > 50 or hispanic and >  65) ASSESSMENT/PLAN: utd or advised      Medical nutritional therapy for individuals with diabetes or renal disease ASSESSMENT/PLAN: see orders      Cardiovascular screening blood tests (lipids q5y) ASSESSMENT/PLAN: see orders and labs      Diabetes screening tests ASSESSMENT/PLAN: see orders and labs   7.) Summary: -risk factors and conditions per above assessment were discussed and treatment, recommendations and referrals were offered per documentation above and orders and patient instructions.  Medicare annual wellness visit, subsequent -risk factors and conditions per above assessment were discussed and treatment, recommendations and referrals were offered per documentation above and orders and patient instructions. -bone density advised and ordered -cologuard testing advised and ordered -mammogram advised  Essential hypertension -cont current meds -labs  ST elevation myocardial infarction involving left anterior descending (LAD) coronary artery ([place]) -managed by cardiology -cont current meds -advised healthy lifestyle  Hyperlipidemia with target LDL less than 70 -labs today, cont statin, lifestyle recs  Gastroesophageal reflux disease, esophagitis presence not specified -discussed risks PPI, advised lowest effective dose  Tobacco use disorder -glad she quit, counseled on lung cancer screening and advised, declined  Hyperglycemia -labs -lifestyle recs  Osteoporosis, unspecified osteoporosis type, unspecified pathological fracture presence -on fosamax -bone density advised  Patient Instructions  BEFORE YOU LEAVE: -follow up: 3-4 months -cologuard test order -flu shot -labs  Please call today to schedule your bone density and mammogram tests.  Please complete the cologuard test for colon cancer screening. Call us if you have not received this kit in the next few weeks.  Vit D3 [phone] IU daily.  We have ordered labs or studies at this visit. It can take  up to 1-2 weeks for results and processing. IF results require follow up or explanation, we will call you with instructions. Clinically stable results will be released to your [facility] If you have not heard from [place] or cannot find your results in [facility] in 2 weeks please contact our office at [phone].  If you are not yet signed up for [facility], please consider signing up.   We recommend the following healthy lifestyle for LIFE: 1) Small portions.   Tip: eat off of a salad plate instead of a dinner plate.  Tip: if you need more or a snack choose fruits, veggies and/or a handful of nuts or seeds.  2) Eat a healthy clean diet.  * Tip: Avoid (less then 1 serving per week): processed foods, sweets, sweetened drinks, white starches (rice, flour, bread, potatoes, pasta, etc), red meat, fast foods, butter  *Tip: CHOOSE instead   * 5-9 servings per  day of fresh or frozen fruits and vegetables (but not corn, potatoes, bananas, canned or dried fruit)   *nuts and seeds, beans   *olives and olive oil   *small portions of lean meats such as fish and white chicken    *small portions of whole grains  3)Get at least 150 minutes of sweaty aerobic exercise per week.  4)Reduce stress - consider counseling, meditation and relaxation to balance other aspects of your life.          Colin Benton R., DO

## 2016-08-21 ENCOUNTER — Encounter: Payer: Self-pay | Admitting: Family Medicine

## 2016-08-21 ENCOUNTER — Ambulatory Visit (INDEPENDENT_AMBULATORY_CARE_PROVIDER_SITE_OTHER): Payer: Commercial Managed Care - HMO | Admitting: Family Medicine

## 2016-08-21 VITALS — BP 124/76 | HR 86 | Temp 97.9°F | Ht 65.75 in | Wt 171.8 lb

## 2016-08-21 DIAGNOSIS — I2102 ST elevation (STEMI) myocardial infarction involving left anterior descending coronary artery: Secondary | ICD-10-CM

## 2016-08-21 DIAGNOSIS — I10 Essential (primary) hypertension: Secondary | ICD-10-CM | POA: Diagnosis not present

## 2016-08-21 DIAGNOSIS — Z0000 Encounter for general adult medical examination without abnormal findings: Secondary | ICD-10-CM | POA: Diagnosis not present

## 2016-08-21 DIAGNOSIS — Z23 Encounter for immunization: Secondary | ICD-10-CM

## 2016-08-21 DIAGNOSIS — M810 Age-related osteoporosis without current pathological fracture: Secondary | ICD-10-CM

## 2016-08-21 DIAGNOSIS — F17200 Nicotine dependence, unspecified, uncomplicated: Secondary | ICD-10-CM

## 2016-08-21 DIAGNOSIS — E785 Hyperlipidemia, unspecified: Secondary | ICD-10-CM

## 2016-08-21 DIAGNOSIS — R739 Hyperglycemia, unspecified: Secondary | ICD-10-CM | POA: Diagnosis not present

## 2016-08-21 DIAGNOSIS — K219 Gastro-esophageal reflux disease without esophagitis: Secondary | ICD-10-CM | POA: Diagnosis not present

## 2016-08-21 LAB — CBC
HEMATOCRIT: 41 % (ref 36.0–46.0)
HEMOGLOBIN: 14.1 g/dL (ref 12.0–15.0)
MCHC: 34.4 g/dL (ref 30.0–36.0)
MCV: 88.1 fl (ref 78.0–100.0)
PLATELETS: 363 10*3/uL (ref 150.0–400.0)
RBC: 4.66 Mil/uL (ref 3.87–5.11)
RDW: 13.9 % (ref 11.5–15.5)
WBC: 8.8 10*3/uL (ref 4.0–10.5)

## 2016-08-21 LAB — BASIC METABOLIC PANEL
BUN: 13 mg/dL (ref 6–23)
CALCIUM: 9.7 mg/dL (ref 8.4–10.5)
CHLORIDE: 100 meq/L (ref 96–112)
CO2: 29 meq/L (ref 19–32)
Creatinine, Ser: 1.06 mg/dL (ref 0.40–1.20)
GFR: 54.6 mL/min — ABNORMAL LOW (ref >60.00)
GLUCOSE: 100 mg/dL — AB (ref 70–99)
POTASSIUM: 4.9 meq/L (ref 3.5–5.1)
SODIUM: 137 meq/L (ref 135–145)

## 2016-08-21 LAB — LIPID PANEL
CHOLESTEROL: 132 mg/dL (ref 0–200)
HDL: 38 mg/dL — ABNORMAL LOW (ref >39.00)
LDL Cholesterol: 59 mg/dL (ref 0–99)
NonHDL: 93.77
Total CHOL/HDL Ratio: 3
Triglycerides: 174 mg/dL — ABNORMAL HIGH (ref 0.0–149.0)
VLDL: 34.8 mg/dL (ref 0.0–40.0)

## 2016-08-21 LAB — HEMOGLOBIN A1C: HEMOGLOBIN A1C: 6.3 % (ref 4.6–6.5)

## 2016-08-21 MED ORDER — PANTOPRAZOLE SODIUM 20 MG PO TBEC: 20.0000 mg | DELAYED_RELEASE_TABLET | Freq: Every day | ORAL | 3 refills | Status: DC

## 2016-08-21 NOTE — Progress Notes (Signed)
Pre visit review using our clinic review tool, if applicable. No additional management support is needed unless otherwise documented below in the visit note. 

## 2016-08-21 NOTE — Patient Instructions (Addendum)
BEFORE YOU LEAVE: -follow up: 3-4 months -cologuard test order -flu shot -labs  Please call today to schedule your bone density and mammogram tests.  Please complete the cologuard test for colon cancer screening. Call us if you have not received this kit in the next few weeks.  Vit D3 [phone] IU daily.  We have ordered labs or studies at this visit. It can take up to 1-2 weeks for results and processing. IF results require follow up or explanation, we will call you with instructions. Clinically stable results will be released to your [facility]. If you have not heard from [place] or cannot find your results in [facility] in 2 weeks please contact our office at [phone].  If you are not yet signed up for [facility], please consider signing up.   We recommend the following healthy lifestyle for LIFE: 1) Small portions.   Tip: eat off of a salad plate instead of a dinner plate.  Tip: if you need more or a snack choose fruits, veggies and/or a handful of nuts or seeds.  2) Eat a healthy clean diet.  * Tip: Avoid (less then 1 serving per week): processed foods, sweets, sweetened drinks, white starches (rice, flour, bread, potatoes, pasta, etc), red meat, fast foods, butter  *Tip: CHOOSE instead   * 5-9 servings per day of fresh or frozen fruits and vegetables (but not corn, potatoes, bananas, canned or dried fruit)   *nuts and seeds, beans   *olives and olive oil   *small portions of lean meats such as fish and white chicken    *small portions of whole grains  3)Get at least 150 minutes of sweaty aerobic exercise per week.  4)Reduce stress - consider counseling, meditation and relaxation to balance other aspects of your life.

## 2016-08-26 ENCOUNTER — Ambulatory Visit (INDEPENDENT_AMBULATORY_CARE_PROVIDER_SITE_OTHER): Payer: Commercial Managed Care - HMO | Admitting: Cardiology

## 2016-08-26 ENCOUNTER — Encounter: Payer: Self-pay | Admitting: Cardiology

## 2016-08-26 VITALS — BP 104/80 | HR 77 | Ht 65.0 in | Wt 170.4 lb

## 2016-08-26 DIAGNOSIS — I10 Essential (primary) hypertension: Secondary | ICD-10-CM | POA: Diagnosis not present

## 2016-08-26 DIAGNOSIS — R0602 Shortness of breath: Secondary | ICD-10-CM

## 2016-08-26 DIAGNOSIS — I2510 Atherosclerotic heart disease of native coronary artery without angina pectoris: Secondary | ICD-10-CM | POA: Diagnosis not present

## 2016-08-26 DIAGNOSIS — I739 Peripheral vascular disease, unspecified: Secondary | ICD-10-CM

## 2016-08-26 MED ORDER — NITROGLYCERIN 0.4 MG SL SUBL: 0.4000 mg | SUBLINGUAL_TABLET | SUBLINGUAL | 11 refills | Status: DC | PRN

## 2016-08-26 MED ORDER — NITROGLYCERIN 0.4 MG SL SUBL: 0.4000 mg | SUBLINGUAL_TABLET | SUBLINGUAL | 3 refills | Status: DC | PRN

## 2016-08-26 MED ORDER — POTASSIUM CHLORIDE CRYS ER 20 MEQ PO TBCR: 20.0000 meq | EXTENDED_RELEASE_TABLET | Freq: Every day | ORAL | 3 refills | Status: DC

## 2016-08-26 NOTE — Patient Instructions (Signed)
Medication Instructions:  Decrease KCL (potassium ) to 20 mEq daily  Labwork: None   Testing/Procedures: Your physician has requested that you have a lower extremity arterial duplex. This test is an ultrasound of the arteries in the legs . It looks at arterial blood flow in the legs. Allow one hour for Lower Arterial scans. There are no restrictions or special instructions   Follow-Up: Your physician wants you to follow-up in: 1 year with Dr Excell Seltzer. (October 2018).You will receive a reminder letter in the mail two months in advance. If you don't receive a letter, please call our office to schedule the follow-up appointment.        If you need a refill on your cardiac medications before your next appointment, please call your pharmacy.

## 2016-08-27 NOTE — Progress Notes (Addendum)
Patient ID: Melissa KelchOuida J Nixon, female   DOB: 1946-01-08, 70 y.o.   MRN: [id number] PCP: Dr. Selena BattenKim  70 yo with history of active smoking, CAD s/p anterior MI, and ischemic cardiomyopathy presents for cardiology followup. She was noted to have a VSD in childhood.  This was not seen on her recent echoes, suspect VSD has closed.  In 3/15, she came to the ER with a late presentation anterior MI.  She had Promus DES to the proximal LAD complicated by some distal embolization.  Echo in 4/15 showed EF 35-40%, but echo in 11/15 showed EF back up to 60-65%.  She quit smoking in 5/17.  PFTs in 3/16 showed minimal obstructive airways disease.   She has occasional atypical chest pain (episode every few weeks).  No pattern, not exertional.  No change to chronic pattern.  She is short of breath if she "walks fast."  This is stable.  No palpitations.  Legs get tired but do not hurt.   Labs (11/15): K 4.3, creatinine 1.01, LDL 65, HDL 39 Labs (10/17): K 4.9, creatinine 1.06, LDL 59, HDL 38, HCT 44  ECG: NSR, RBBB, LAFB  PMH: 1. PFTs (3/16) with minimal obstructive airways disease.  2. Hyperlipidemia 3. GERD 4. HTN 5. CAD: Late presentation anterior MI in 3/15.  LHC showed totally occluded proximal LAD with 40% LCx stenosis.  EF 45-50% by LV-gram.  She had Promus DES to proximal LAD with some distal embolization.   6. History of VSD from childhood.  No VSD noted on recent echoes.  7. Ischemic cardiomyopathy: Echo (4/15) with EF 35-40%.  Echo (11/15) with EF up to 60-65%.    SH: Married, [place], quit smoking 5/17.   FH: CAD  ROS: All systems reviewed and negative except as per HPI.   Current Outpatient Prescriptions  Medication Sig Dispense Refill  . alendronate (FOSAMAX) 10 MG tablet TAKE 1 TABLET DAILY BEFORE BREAKFAST. TAKE WITH A FULL GLASS OF WATER ON AN EMPTY STOMACH. 90 tablet 1  . aspirin 81 MG tablet Take 81 mg by mouth daily.    Marland Kitchen. atorvastatin (LIPITOR) 40 MG tablet Take 1 tablet (40 mg  total) by mouth daily at 6 PM. 90 tablet 0  . clopidogrel (PLAVIX) 75 MG tablet Take 1 tablet (75 mg total) by mouth daily. 90 tablet 0  . furosemide (LASIX) 20 MG tablet Take 1 tablet (20 mg total) by mouth daily. 90 tablet 0  . Loratadine-Pseudoephedrine (CLARITIN-D 12 HOUR PO) Take 1 tablet by mouth daily.     Marland Kitchen. losartan (COZAAR) 25 MG tablet Take 0.5 tablets (12.5 mg total) by mouth daily. 45 tablet 0  . metoprolol tartrate (LOPRESSOR) 25 MG tablet Take 1 tablet (25 mg total) by mouth 2 (two) times daily. 180 tablet 0  . nitroGLYCERIN (NITROSTAT) 0.4 MG SL tablet Place 1 tablet (0.4 mg total) under the tongue every 5 (five) minutes x 3 doses as needed for chest pain. 25 tablet 11  . pantoprazole (PROTONIX) 20 MG tablet Take 1 tablet (20 mg total) by mouth daily. 90 tablet 3  . potassium chloride SA (K-DUR,KLOR-CON) 20 MEQ tablet Take 1 tablet (20 mEq total) by mouth daily. 90 tablet 3   No current facility-administered medications for this visit.    BP 104/80 (BP Location: Left Arm, Patient Position: Sitting, Cuff Size: Normal)   Pulse 77   Ht 5\' 5"  (1.651 m)   Wt 170 lb 6.4 oz (77.3 kg)   BMI 28.36 kg/m  General: NAD Neck: No JVD, no thyromegaly or thyroid nodule.  Lungs: Prolonged expiratory phase.  CV: Nondisplaced PMI.  Heart with somewhat distant sounds, regular S1/S2, no S3/S4, no murmur.  No peripheral edema.  No carotid bruit.  Unable to palpate pedal pulses.  Abdomen: Soft, nontender, no hepatosplenomegaly, no distention.  Skin: Intact without lesions or rashes.  Neurologic: Alert and oriented x 3.  Psych: Normal affect. Extremities: No clubbing or cyanosis.  HEENT: Normal.   Assessment/Plan:  1. PAD: Unable to palpate pedal pulses. I will arrange for screening ABIs.   2. Smoking: She finally quit in 5/17.  3. Hyperlipidemia: Continue current statin, good lipids recently. 4. CAD: Stable, continue ASA 81, statin, Plavix, metoprolol.  DAPT score = 4, so will continue  Plavix long-term as long as there are no bleeding events.  She has only rare atypical chest pain.   Given my transition to CHF clinic, she will see Dr. Excell Nixon in 1 year.   Melissa Nixon 08/27/2016

## 2016-09-13 ENCOUNTER — Other Ambulatory Visit: Payer: Self-pay | Admitting: Cardiology

## 2016-09-17 ENCOUNTER — Other Ambulatory Visit: Payer: Self-pay | Admitting: Cardiology

## 2016-09-17 DIAGNOSIS — I739 Peripheral vascular disease, unspecified: Secondary | ICD-10-CM

## 2016-09-23 ENCOUNTER — Encounter (HOSPITAL_COMMUNITY): Payer: Self-pay | Admitting: Cardiology

## 2016-09-23 ENCOUNTER — Ambulatory Visit (HOSPITAL_COMMUNITY)
Admission: RE | Admit: 2016-09-23 | Discharge: 2016-09-23 | Disposition: A | Discharge status: Home / Self Care | Payer: Commercial Managed Care - HMO | Source: Ambulatory Visit | Attending: Cardiovascular Disease | Admitting: Cardiovascular Disease

## 2016-09-23 DIAGNOSIS — I10 Essential (primary) hypertension: Secondary | ICD-10-CM | POA: Insufficient documentation

## 2016-09-23 DIAGNOSIS — I2510 Atherosclerotic heart disease of native coronary artery without angina pectoris: Secondary | ICD-10-CM | POA: Insufficient documentation

## 2016-09-23 DIAGNOSIS — R938 Abnormal findings on diagnostic imaging of other specified body structures: Secondary | ICD-10-CM | POA: Insufficient documentation

## 2016-09-23 DIAGNOSIS — E785 Hyperlipidemia, unspecified: Secondary | ICD-10-CM | POA: Diagnosis not present

## 2016-09-23 DIAGNOSIS — I739 Peripheral vascular disease, unspecified: Secondary | ICD-10-CM | POA: Insufficient documentation

## 2016-09-23 DIAGNOSIS — Z87891 Personal history of nicotine dependence: Secondary | ICD-10-CM | POA: Diagnosis not present

## 2017-01-24 ENCOUNTER — Other Ambulatory Visit: Payer: Self-pay | Admitting: Family Medicine

## 2017-03-10 DIAGNOSIS — E559 Vitamin D deficiency, unspecified: Secondary | ICD-10-CM | POA: Diagnosis not present

## 2017-03-10 DIAGNOSIS — I2510 Atherosclerotic heart disease of native coronary artery without angina pectoris: Secondary | ICD-10-CM | POA: Diagnosis not present

## 2017-03-10 DIAGNOSIS — R011 Cardiac murmur, unspecified: Secondary | ICD-10-CM | POA: Diagnosis not present

## 2017-03-10 DIAGNOSIS — E785 Hyperlipidemia, unspecified: Secondary | ICD-10-CM | POA: Diagnosis not present

## 2017-03-10 DIAGNOSIS — M810 Age-related osteoporosis without current pathological fracture: Secondary | ICD-10-CM | POA: Diagnosis not present

## 2017-03-10 DIAGNOSIS — K219 Gastro-esophageal reflux disease without esophagitis: Secondary | ICD-10-CM | POA: Diagnosis not present

## 2017-03-10 DIAGNOSIS — T7840XA Allergy, unspecified, initial encounter: Secondary | ICD-10-CM | POA: Diagnosis not present

## 2017-07-16 ENCOUNTER — Encounter: Payer: Self-pay | Admitting: Family Medicine

## 2017-08-03 DIAGNOSIS — Z23 Encounter for immunization: Secondary | ICD-10-CM | POA: Diagnosis not present

## 2017-09-05 ENCOUNTER — Other Ambulatory Visit: Payer: Self-pay | Admitting: Cardiology

## 2017-09-05 DIAGNOSIS — R0602 Shortness of breath: Secondary | ICD-10-CM

## 2017-09-05 DIAGNOSIS — I10 Essential (primary) hypertension: Secondary | ICD-10-CM

## 2017-09-05 DIAGNOSIS — I739 Peripheral vascular disease, unspecified: Secondary | ICD-10-CM

## 2017-09-05 DIAGNOSIS — I2510 Atherosclerotic heart disease of native coronary artery without angina pectoris: Secondary | ICD-10-CM

## 2017-09-08 ENCOUNTER — Other Ambulatory Visit: Payer: Self-pay | Admitting: Internal Medicine

## 2017-09-08 DIAGNOSIS — K219 Gastro-esophageal reflux disease without esophagitis: Secondary | ICD-10-CM | POA: Diagnosis not present

## 2017-09-08 DIAGNOSIS — M810 Age-related osteoporosis without current pathological fracture: Secondary | ICD-10-CM | POA: Diagnosis not present

## 2017-09-08 DIAGNOSIS — Z0000 Encounter for general adult medical examination without abnormal findings: Secondary | ICD-10-CM | POA: Diagnosis not present

## 2017-09-08 DIAGNOSIS — Z1239 Encounter for other screening for malignant neoplasm of breast: Secondary | ICD-10-CM | POA: Diagnosis not present

## 2017-09-08 DIAGNOSIS — Z1389 Encounter for screening for other disorder: Secondary | ICD-10-CM | POA: Diagnosis not present

## 2017-09-08 DIAGNOSIS — E559 Vitamin D deficiency, unspecified: Secondary | ICD-10-CM | POA: Diagnosis not present

## 2017-09-08 DIAGNOSIS — E785 Hyperlipidemia, unspecified: Secondary | ICD-10-CM | POA: Diagnosis not present

## 2017-09-08 DIAGNOSIS — Z139 Encounter for screening, unspecified: Secondary | ICD-10-CM

## 2017-09-08 DIAGNOSIS — T7840XA Allergy, unspecified, initial encounter: Secondary | ICD-10-CM | POA: Diagnosis not present

## 2017-09-08 DIAGNOSIS — Z79899 Other long term (current) drug therapy: Secondary | ICD-10-CM | POA: Diagnosis not present

## 2017-09-08 DIAGNOSIS — I2510 Atherosclerotic heart disease of native coronary artery without angina pectoris: Secondary | ICD-10-CM | POA: Diagnosis not present

## 2017-09-08 DIAGNOSIS — R011 Cardiac murmur, unspecified: Secondary | ICD-10-CM | POA: Diagnosis not present

## 2017-09-14 ENCOUNTER — Ambulatory Visit: Payer: Medicare HMO | Admitting: Cardiovascular Disease

## 2017-09-14 ENCOUNTER — Encounter: Payer: Self-pay | Admitting: Cardiovascular Disease

## 2017-09-14 VITALS — BP 120/80 | HR 75 | Ht 67.0 in | Wt 166.8 lb

## 2017-09-14 DIAGNOSIS — I739 Peripheral vascular disease, unspecified: Secondary | ICD-10-CM

## 2017-09-14 DIAGNOSIS — I10 Essential (primary) hypertension: Secondary | ICD-10-CM | POA: Diagnosis not present

## 2017-09-14 DIAGNOSIS — R0602 Shortness of breath: Secondary | ICD-10-CM | POA: Diagnosis not present

## 2017-09-14 DIAGNOSIS — I2510 Atherosclerotic heart disease of native coronary artery without angina pectoris: Secondary | ICD-10-CM

## 2017-09-14 DIAGNOSIS — R42 Dizziness and giddiness: Secondary | ICD-10-CM

## 2017-09-14 MED ORDER — POTASSIUM CHLORIDE CRYS ER 20 MEQ PO TBCR: 20.0000 meq | EXTENDED_RELEASE_TABLET | Freq: Every day | ORAL | 3 refills | Status: DC

## 2017-09-14 MED ORDER — CLOPIDOGREL BISULFATE 75 MG PO TABS: 75.0000 mg | ORAL_TABLET | Freq: Every day | ORAL | 3 refills | Status: AC

## 2017-09-14 MED ORDER — METOPROLOL TARTRATE 25 MG PO TABS: 25.0000 mg | ORAL_TABLET | Freq: Two times a day (BID) | ORAL | 3 refills | Status: DC

## 2017-09-14 MED ORDER — ATORVASTATIN CALCIUM 40 MG PO TABS: 40.0000 mg | ORAL_TABLET | Freq: Every day | ORAL | 3 refills | Status: DC

## 2017-09-14 MED ORDER — LOSARTAN POTASSIUM 25 MG PO TABS: 12.5000 mg | ORAL_TABLET | Freq: Every day | ORAL | 3 refills | Status: DC

## 2017-09-14 MED ORDER — FUROSEMIDE 20 MG PO TABS: 20.0000 mg | ORAL_TABLET | Freq: Every day | ORAL | 3 refills | Status: DC

## 2017-09-14 NOTE — Progress Notes (Signed)
Cardiology Office Note Date:  09/14/2017   ID:  Melissa, Nixon 09-22-1946, MRN [id number]  PCP:  [name], DO  Cardiologist:  Audelia Hives, MD    Chief Complaint  Patient presents with  . Dizziness     History of Present Illness: Melissa Nixon is a 71 y.o. female who presents for follow-up evaluation.  The patient has a history of coronary artery disease with anterior MI in 2015 treated with primary PCI of the proximal LAD using a drug-eluting stent platform.  Initially she had moderate LV dysfunction but her LV function normalized on follow-up echo studies.  The patient quit smoking in 2017.  She has been followed by Dr. Shirlee Latch.  She presents today to establish care after Dr. Alford Highland moved to the heart failure clinic.  The patient is here alone today.  Overall she is doing fairly well.  She does complain of dizziness at times.  There is not really a positional component but sometimes this does occur when she first gets up.  Other times it occurs when she is lying in bed.  There is no associated diaphoresis or shortness of breath.  She denies chest pain or pressure.  No orthopnea, PND, or leg swelling.  She is back to smoking cigarettes but less than before.  Past Medical History:  Diagnosis Date  . Allergic rhinitis due to other allergen   . CAD, multiple vessel, residual disease LCX 40% OM 01/22/2014  . Cholecystitis   . ELECTROCARDIOGRAM, ABNORMAL   . GERD   . Hyperglycemia 01/22/2014  . Hyperlipidemia LDL goal < 70 01/22/2014  . HYPERTENSION   . LOW BACK PAIN 01/02/2010   Qualifier: Diagnosis of  By: Yetta Barre MD, Bernadene Bell.   . LV dysfunction, EF at cath 45-50% 01/22/2014  . Murmur, hx VSD, evaluated by cardiology in 2013 06/03/2012  . Osteoporosis, dexa 2014, fosamax, cal, vit D and exercise advised 06/01/2013  . RBBB, chronic 01/22/2014  . STEMI (ST elevation myocardial infarction), 01/20/14, LAD PCI/Stent DES Promus preimer 01/20/2014  . Tobacco use, stopped in 2013  01/22/2014    Past Surgical History:  Procedure Laterality Date  . ABDOMINAL HYSTERECTOMY  1983  . CORONARY ANGIOPLASTY WITH STENT PLACEMENT  01/20/2014   Promus preimer to LAD with STEMI  . LAPAROSCOPIC CHOLECYSTECTOMY    . LEFT HEART CATH N/A 01/20/2014   Performed by Lennette Bihari, MD at [facility] CATH LAB    Current Outpatient Medications  Medication Sig Dispense Refill  . alendronate (FOSAMAX) 10 MG tablet Take 10 mg daily before breakfast by mouth. Take with a full glass of water on an empty stomach.    Marland Kitchen aspirin 81 MG tablet Take 81 mg by mouth daily.    Marland Kitchen atorvastatin (LIPITOR) 40 MG tablet Take 1 tablet (40 mg total) daily at 6 PM by mouth. 90 tablet 3  . clopidogrel (PLAVIX) 75 MG tablet Take 1 tablet (75 mg total) daily by mouth. 90 tablet 3  . furosemide (LASIX) 20 MG tablet Take 1 tablet (20 mg total) daily by mouth. 90 tablet 3  . Loratadine-Pseudoephedrine (CLARITIN-D 12 HOUR PO) Take 1 tablet by mouth daily.     Marland Kitchen losartan (COZAAR) 25 MG tablet Take 0.5 tablets (12.5 mg total) daily by mouth. 45 tablet 3  . metoprolol tartrate (LOPRESSOR) 25 MG tablet Take 1 tablet (25 mg total) 2 (two) times daily by mouth. 180 tablet 3  . nitroGLYCERIN (NITROSTAT) 0.4 MG SL tablet Place 1 tablet (  0.4 mg total) under the tongue every 5 (five) minutes x 3 doses as needed for chest pain. 25 tablet 11  . pantoprazole (PROTONIX) 20 MG tablet Take 1 tablet (20 mg total) by mouth daily. 90 tablet 3  . potassium chloride SA (KLOR-CON M20) 20 MEQ tablet Take 1 tablet (20 mEq total) daily by mouth. 90 tablet 3   No current facility-administered medications for this visit.     Allergies:   Brilinta [ticagrelor]; Lisinopril; Mushroom extract complex; Other; and Tetracyclines & related   Social History:  The patient  reports that she has quit smoking. Her smoking use included cigarettes. She smoked 1.00 pack per day. she has never used smokeless tobacco. She reports that she does not drink alcohol or use  drugs.   Family History:  The patient's family history includes Diabetes in her unknown relative; Hypertension in her unknown relative.    ROS:  Please see the history of present illness.  Otherwise, review of systems is positive for dizziness.  All other systems are reviewed and negative.    PHYSICAL EXAM: VS:  BP 120/80   Pulse 75   Ht 5\' 7"  (1.702 m)   Wt 166 lb 12.8 oz (75.7 kg)   BMI 26.12 kg/m  , BMI Body mass index is 26.12 kg/m. GEN: Well nourished, well developed, in no acute distress  HEENT: normal  Neck: no JVD, no masses. No carotid bruits Cardiac: RRR without murmur or gallop     Respiratory:  clear to auscultation bilaterally, normal work of breathing GI: soft, nontender, nondistended, + BS MS: no deformity or atrophy  Ext: no pretibial edema Skin: warm and dry, no rash Neuro:  Strength and sensation are intact Psych: euthymic mood, full affect  EKG:  EKG is ordered today. The ekg ordered today shows normal sinus rhythm 75 bpm, right bundle branch block, left anterior fascicular block.  Recent Labs: No results found for requested labs within last 8760 hours.   Lipid Panel     Component Value Date/Time   CHOL 132 08/21/2016 1024   TRIG 174.0 (H) 08/21/2016 1024   HDL 38.00 (L) 08/21/2016 1024   CHOLHDL 3 08/21/2016 1024   VLDL 34.8 08/21/2016 1024   LDLCALC 59 08/21/2016 1024   LDLDIRECT 138.4 07/16/2012 1345      Wt Readings from Last 3 Encounters:  09/14/17 166 lb 12.8 oz (75.7 kg)  08/26/16 170 lb 6.4 oz (77.3 kg)  08/21/16 171 lb 12.8 oz (77.9 kg)     Cardiac Studies Reviewed: Echo 09/25/2014: Study Conclusions  - Left ventricle: The cavity size was normal. Wall thickness was increased in a pattern of mild LVH. Systolic function was normal. The estimated ejection fraction was in the range of 60% to 65%. Although no diagnostic regional wall motion abnormality was identified, this possibility cannot be completely excluded on  the basis of this study. Doppler parameters are consistent with abnormal left ventricular relaxation (grade 1 diastolic dysfunction). - Aortic valve: There was no stenosis. - Mitral valve: There was trivial regurgitation. - Right ventricle: The cavity size was normal. Systolic function was normal. - Tricuspid valve: Peak RV-RA gradient (S): 19 mm Hg. - Pulmonary arteries: PA peak pressure: 22 mm Hg (S). - Inferior vena cava: The vessel was normal in size. The respirophasic diameter changes were in the normal range (>= 50%), consistent with normal central venous pressure.  Impressions:  - Normal LV size with mild LV hypertrophy. EF 60-65%. Normal RV size and systolic function.  No significant valvular abnormalities.  ASSESSMENT AND PLAN: 1. CAD, native vessel, without angina: She continues on dual antiplatelet therapy and is tolerating well.  No bleeding problems.  2.  Peripheral arterial disease: No claudication symptoms.  ABIs were checked last year because of an abnormal pulse exam and these were normal.  She did have abnormal TBI's.  Tobacco cessation counseling is done.  3.  Hyperlipidemia: Last lipids from October 2017 showed a cholesterol 132, LDL 59, HDL 38.  She continues on a statin drug.  4.  Hypertension: Blood pressure is controlled on a combination of losartan and metoprolol.  5. Dizziness: no clear cause. Does not appear to have orthostatic symptoms. Considering her hx of vascular disease will check a carotid duplex exam to evaluate for carotid stenosis.  Current medicines are reviewed with the patient today.  The patient does not have concerns regarding medicines.  Labs/ tests ordered today include:   Orders Placed This Encounter  Procedures  . EKG 12-Lead    Disposition:   FU one year  Signed, Audelia Hives, MD  09/14/2017 5:02 PM    [facility] [address] [place], [place], [place]  [address] Phone: [phone]; Fax:  [phone]

## 2017-09-14 NOTE — Patient Instructions (Signed)
Medication Instructions:  Your provider recommends that you continue on your current medications as directed. Please refer to the Current Medication list given to you today.    Labwork: None  Testing/Procedures: Your physician has requested that you have a carotid duplex. This test is an ultrasound of the carotid arteries in your neck. It looks at blood flow through these arteries that supply the brain with blood. Allow one hour for this exam. There are no restrictions or special instructions.  Follow-Up: Your provider wants you to follow-up in: 1 year with Dr. Cooper. You will receive a reminder letter in the mail two months in advance. If you don't receive a letter, please call our office to schedule the follow-up appointment.    Any Other Special Instructions Will Be Listed Below (If Applicable).     If you need a refill on your cardiac medications before your next appointment, please call your pharmacy.   

## 2017-09-30 ENCOUNTER — Ambulatory Visit (HOSPITAL_COMMUNITY)
Admission: RE | Admit: 2017-09-30 | Discharge: 2017-09-30 | Disposition: A | Discharge status: Home / Self Care | Payer: Medicare HMO | Source: Ambulatory Visit | Attending: Cardiovascular Disease | Admitting: Cardiovascular Disease

## 2017-09-30 DIAGNOSIS — I6521 Occlusion and stenosis of right carotid artery: Secondary | ICD-10-CM | POA: Insufficient documentation

## 2017-09-30 DIAGNOSIS — R42 Dizziness and giddiness: Secondary | ICD-10-CM | POA: Diagnosis not present

## 2017-10-07 ENCOUNTER — Ambulatory Visit: Payer: Commercial Managed Care - HMO

## 2017-10-07 ENCOUNTER — Ambulatory Visit
Admission: RE | Admit: 2017-10-07 | Discharge: 2017-10-07 | Disposition: A | Discharge status: Home / Self Care | Payer: Medicare HMO | Source: Ambulatory Visit | Attending: Internal Medicine | Admitting: Internal Medicine

## 2017-10-07 ENCOUNTER — Ambulatory Visit
Admission: RE | Admit: 2017-10-07 | Discharge: 2017-10-07 | Disposition: A | Discharge status: Home / Self Care | Payer: Commercial Managed Care - HMO | Source: Ambulatory Visit | Attending: Internal Medicine | Admitting: Internal Medicine

## 2017-10-07 DIAGNOSIS — Z139 Encounter for screening, unspecified: Secondary | ICD-10-CM

## 2017-10-07 DIAGNOSIS — Z780 Asymptomatic menopausal state: Secondary | ICD-10-CM | POA: Diagnosis not present

## 2017-10-07 DIAGNOSIS — M810 Age-related osteoporosis without current pathological fracture: Secondary | ICD-10-CM

## 2017-10-07 DIAGNOSIS — Z1231 Encounter for screening mammogram for malignant neoplasm of breast: Secondary | ICD-10-CM | POA: Diagnosis not present

## 2017-11-05 ENCOUNTER — Encounter: Payer: Self-pay | Admitting: Family Medicine

## 2018-01-25 DIAGNOSIS — R109 Unspecified abdominal pain: Secondary | ICD-10-CM | POA: Diagnosis not present

## 2018-07-22 DIAGNOSIS — H52223 Regular astigmatism, bilateral: Secondary | ICD-10-CM | POA: Diagnosis not present

## 2018-08-26 DIAGNOSIS — Z23 Encounter for immunization: Secondary | ICD-10-CM | POA: Diagnosis not present

## 2018-09-14 DIAGNOSIS — M810 Age-related osteoporosis without current pathological fracture: Secondary | ICD-10-CM | POA: Diagnosis not present

## 2018-09-14 DIAGNOSIS — T7840XA Allergy, unspecified, initial encounter: Secondary | ICD-10-CM | POA: Diagnosis not present

## 2018-09-14 DIAGNOSIS — K219 Gastro-esophageal reflux disease without esophagitis: Secondary | ICD-10-CM | POA: Diagnosis not present

## 2018-09-14 DIAGNOSIS — R011 Cardiac murmur, unspecified: Secondary | ICD-10-CM | POA: Diagnosis not present

## 2018-09-14 DIAGNOSIS — H9193 Unspecified hearing loss, bilateral: Secondary | ICD-10-CM | POA: Diagnosis not present

## 2018-09-14 DIAGNOSIS — Z0000 Encounter for general adult medical examination without abnormal findings: Secondary | ICD-10-CM | POA: Diagnosis not present

## 2018-09-14 DIAGNOSIS — Z1389 Encounter for screening for other disorder: Secondary | ICD-10-CM | POA: Diagnosis not present

## 2018-09-14 DIAGNOSIS — Z79899 Other long term (current) drug therapy: Secondary | ICD-10-CM | POA: Diagnosis not present

## 2018-09-14 DIAGNOSIS — E559 Vitamin D deficiency, unspecified: Secondary | ICD-10-CM | POA: Diagnosis not present

## 2018-09-14 DIAGNOSIS — I2510 Atherosclerotic heart disease of native coronary artery without angina pectoris: Secondary | ICD-10-CM | POA: Diagnosis not present

## 2018-09-14 DIAGNOSIS — E785 Hyperlipidemia, unspecified: Secondary | ICD-10-CM | POA: Diagnosis not present

## 2018-09-15 ENCOUNTER — Encounter: Payer: Self-pay | Admitting: Cardiovascular Disease

## 2018-09-15 ENCOUNTER — Ambulatory Visit: Payer: Medicare HMO | Admitting: Cardiovascular Disease

## 2018-09-15 DIAGNOSIS — I2510 Atherosclerotic heart disease of native coronary artery without angina pectoris: Secondary | ICD-10-CM | POA: Diagnosis not present

## 2018-09-15 DIAGNOSIS — I739 Peripheral vascular disease, unspecified: Secondary | ICD-10-CM | POA: Diagnosis not present

## 2018-09-15 DIAGNOSIS — R0602 Shortness of breath: Secondary | ICD-10-CM

## 2018-09-15 DIAGNOSIS — I10 Essential (primary) hypertension: Secondary | ICD-10-CM

## 2018-09-15 MED ORDER — LOSARTAN POTASSIUM 25 MG PO TABS
12.5000 mg | ORAL_TABLET | Freq: Every day | ORAL | 3 refills | Status: DC
Start: 2018-09-15 — End: 2019-10-03

## 2018-09-15 MED ORDER — NITROGLYCERIN 0.4 MG SL SUBL: 0.4000 mg | SUBLINGUAL_TABLET | SUBLINGUAL | 3 refills | Status: DC | PRN

## 2018-09-15 MED ORDER — FUROSEMIDE 20 MG PO TABS
20.0000 mg | ORAL_TABLET | Freq: Every day | ORAL | 3 refills | Status: DC
Start: 2018-09-15 — End: 2019-10-03

## 2018-09-15 MED ORDER — ATORVASTATIN CALCIUM 40 MG PO TABS
40.0000 mg | ORAL_TABLET | ORAL | 3 refills | Status: DC
Start: 2018-09-15 — End: 2019-10-03

## 2018-09-15 MED ORDER — POTASSIUM CHLORIDE CRYS ER 20 MEQ PO TBCR: 20.0000 meq | EXTENDED_RELEASE_TABLET | Freq: Every day | ORAL | 3 refills | Status: DC

## 2018-09-15 MED ORDER — METOPROLOL TARTRATE 25 MG PO TABS
25.0000 mg | ORAL_TABLET | Freq: Two times a day (BID) | ORAL | 3 refills | Status: DC
Start: 2018-09-15 — End: 2019-10-03

## 2018-09-15 NOTE — Patient Instructions (Signed)

## 2018-09-15 NOTE — Progress Notes (Signed)
Cardiology Office Note:    Date:  09/15/2018   ID:  Melissa Nixon, DOB 1945-12-20, MRN [id number]  PCP:  [name], DO  Cardiologist:  Tonny Bollman, MD  Electrophysiologist:  None   Referring MD: [name], DO   Chief Complaint  Patient presents with  . Coronary Artery Disease    History of Present Illness:    Melissa Nixon is a 72 y.o. female with a hx of coronary artery disease, presenting for follow-up evaluation.  The patient initially presented with an anterior wall MI in 2015 treated with primary PCI of the proximal LAD using a drug-eluting stent.  LV function normalized on follow-up.  The patient had quit smoking, but is back to smoking again now about 1/3 pack per day. She has had chronic dizziness but this is improved after reducing her atorvastatin to every other day.   She is here alone today. Today, she denies symptoms of palpitations, chest pain, orthopnea, PND, lower extremity edema, dizziness, or syncope. She has exertional dyspnea, worse in the summer months especially if humidity is high.    Past Medical History:  Diagnosis Date  . Allergic rhinitis due to other allergen   . CAD, multiple vessel, residual disease LCX 40% OM 01/22/2014  . Cholecystitis   . ELECTROCARDIOGRAM, ABNORMAL   . GERD   . Hyperglycemia 01/22/2014  . Hyperlipidemia LDL goal < 70 01/22/2014  . HYPERTENSION   . LOW BACK PAIN 01/02/2010   Qualifier: Diagnosis of  By: Yetta Barre MD, Bernadene Bell.   . LV dysfunction, EF at cath 45-50% 01/22/2014  . Murmur, hx VSD, evaluated by cardiology in 2013 06/03/2012  . Osteoporosis, dexa 2014, fosamax, cal, vit D and exercise advised 06/01/2013  . RBBB, chronic 01/22/2014  . STEMI (ST elevation myocardial infarction), 01/20/14, LAD PCI/Stent DES Promus preimer 01/20/2014  . Tobacco use, stopped in 2013 01/22/2014    Past Surgical History:  Procedure Laterality Date  . ABDOMINAL HYSTERECTOMY  1983  . CORONARY ANGIOPLASTY WITH STENT PLACEMENT  01/20/2014     Promus preimer to LAD with STEMI  . LAPAROSCOPIC CHOLECYSTECTOMY    . LEFT HEART CATH N/A 01/20/2014   Procedure: LEFT HEART CATH;  Surgeon: Lennette Bihari, MD;  Location: [facility] CATH LAB;  Service: Cardiovascular;  Laterality: N/A;    Current Medications: Current Meds  Medication Sig  . alendronate (FOSAMAX) 10 MG tablet Take 10 mg daily before breakfast by mouth. Take with a full glass of water on an empty stomach.  . Ascorbic Acid (VITAMIN C PO) Take by mouth daily.  Marland Kitchen aspirin 81 MG tablet Take 81 mg by mouth daily.  Marland Kitchen atorvastatin (LIPITOR) 40 MG tablet Take 1 tablet (40 mg total) by mouth every other day.  . furosemide (LASIX) 20 MG tablet Take 1 tablet (20 mg total) by mouth daily.  . Loratadine-Pseudoephedrine (CLARITIN-D 12 HOUR PO) Take 1 tablet by mouth daily.   Marland Kitchen losartan (COZAAR) 25 MG tablet Take 0.5 tablets (12.5 mg total) by mouth daily.  . metoprolol tartrate (LOPRESSOR) 25 MG tablet Take 1 tablet (25 mg total) by mouth 2 (two) times daily.  . nitroGLYCERIN (NITROSTAT) 0.4 MG SL tablet Place 1 tablet (0.4 mg total) under the tongue every 5 (five) minutes x 3 doses as needed for chest pain.  . pantoprazole (PROTONIX) 20 MG tablet Take 1 tablet (20 mg total) by mouth daily.  . potassium chloride SA (KLOR-CON M20) 20 MEQ tablet Take 1 tablet (20 mEq total)  by mouth daily.  Marland Kitchen VITAMIN D PO Take by mouth daily.  . [DISCONTINUED] atorvastatin (LIPITOR) 40 MG tablet Take 40 mg by mouth every other day.  . [DISCONTINUED] furosemide (LASIX) 20 MG tablet Take 1 tablet (20 mg total) daily by mouth.  . [DISCONTINUED] losartan (COZAAR) 25 MG tablet Take 0.5 tablets (12.5 mg total) daily by mouth.  . [DISCONTINUED] metoprolol tartrate (LOPRESSOR) 25 MG tablet Take 1 tablet (25 mg total) 2 (two) times daily by mouth.  . [DISCONTINUED] nitroGLYCERIN (NITROSTAT) 0.4 MG SL tablet Place 1 tablet (0.4 mg total) under the tongue every 5 (five) minutes x 3 doses as needed for chest pain.  .  [DISCONTINUED] potassium chloride SA (KLOR-CON M20) 20 MEQ tablet Take 1 tablet (20 mEq total) daily by mouth.     Allergies:   Brilinta [ticagrelor]; Lisinopril; Mushroom extract complex; Other; and Tetracyclines & related   Social History   Socioeconomic History  . Marital status: Married    Spouse name: Not on file  . Number of children: Not on file  . Years of education: Not on file  . Highest education level: Not on file  Occupational History  . Not on file  Social Needs  . Financial resource strain: Not on file  . Food insecurity:    Worry: Not on file    Inability: Not on file  . Transportation needs:    Medical: Not on file    Non-medical: Not on file  Tobacco Use  . Smoking status: Former Smoker    Packs/day: 1.00    Types: Cigarettes  . Smokeless tobacco: Never Used  . Tobacco comment: per patient-quit 02/2016  Substance and Sexual Activity  . Alcohol use: No    Alcohol/week: 0.0 standard drinks  . Drug use: No  . Sexual activity: Not on file  Lifestyle  . Physical activity:    Days per week: Not on file    Minutes per session: Not on file  . Stress: Not on file  Relationships  . Social connections:    Talks on phone: Not on file    Gets together: Not on file    Attends religious service: Not on file    Active member of club or organization: Not on file    Attends meetings of clubs or organizations: Not on file    Relationship status: Not on file  Other Topics Concern  . Not on file  Social History Narrative  . Not on file     Family History: The patient's family history includes Diabetes in her unknown relative; Hypertension in her unknown relative.  ROS:   Please see the history of present illness.    All other systems reviewed and are negative.  EKGs/Labs/Other Studies Reviewed:    Carotid Doppler 09-30-2017: Patent carotids bilaterally <40%  EKG:  EKG is ordered today.  The ekg ordered today demonstrates NSr with RBBB, LAFB - no change from  last year's tracing  Recent Labs: No results found for requested labs within last 8760 hours.  Recent Lipid Panel    Component Value Date/Time   CHOL 132 08/21/2016 1024   TRIG 174.0 (H) 08/21/2016 1024   HDL 38.00 (L) 08/21/2016 1024   CHOLHDL 3 08/21/2016 1024   VLDL 34.8 08/21/2016 1024   LDLCALC 59 08/21/2016 1024   LDLDIRECT 138.4 07/16/2012 1345    Physical Exam:    VS:  BP 120/78   Pulse 68   Ht 5\' 7"  (1.702 m)   Wt 156  lb (70.8 kg)   SpO2 97%   BMI 24.43 kg/m     Wt Readings from Last 3 Encounters:  09/15/18 156 lb (70.8 kg)  09/14/17 166 lb 12.8 oz (75.7 kg)  08/26/16 170 lb 6.4 oz (77.3 kg)     GEN:  Well nourished, well developed in no acute distress HEENT: Normal NECK: No JVD; No carotid bruits LYMPHATICS: No lymphadenopathy CARDIAC: RRR, no murmurs, rubs, gallops RESPIRATORY:  Clear to auscultation without rales, wheezing or rhonchi  ABDOMEN: Soft, non-tender, non-distended MUSCULOSKELETAL:  No edema; No deformity  SKIN: Warm and dry NEUROLOGIC:  Alert and oriented x 3 PSYCHIATRIC:  Normal affect   ASSESSMENT:    1. Shortness of breath   2. Atherosclerosis of native coronary artery of native heart without angina pectoris   3. Essential hypertension   4. CAD, multiple vessel, residual disease LCX 40% OM   5. Claudication [facility])    PLAN:    In order of problems listed above:  1. We discussed the importance of complete tobacco cessation.  Her physical exam is unremarkable. 2. No anginal symptoms.  She is treated with aspirin, a beta-blocker, and a statin drug. 3. Blood pressure is well controlled on losartan, metoprolol, and furosemide  Medication Adjustments/Labs and Tests Ordered: Current medicines are reviewed at length with the patient today.  Concerns regarding medicines are outlined above.  Orders Placed This Encounter  Procedures  . EKG 12-Lead   Meds ordered this encounter  Medications  . nitroGLYCERIN (NITROSTAT) 0.4 MG SL tablet      Sig: Place 1 tablet (0.4 mg total) under the tongue every 5 (five) minutes x 3 doses as needed for chest pain.    Dispense:  25 tablet    Refill:  3  . atorvastatin (LIPITOR) 40 MG tablet    Sig: Take 1 tablet (40 mg total) by mouth every other day.    Dispense:  45 tablet    Refill:  3  . furosemide (LASIX) 20 MG tablet    Sig: Take 1 tablet (20 mg total) by mouth daily.    Dispense:  90 tablet    Refill:  3  . losartan (COZAAR) 25 MG tablet    Sig: Take 0.5 tablets (12.5 mg total) by mouth daily.    Dispense:  45 tablet    Refill:  3  . metoprolol tartrate (LOPRESSOR) 25 MG tablet    Sig: Take 1 tablet (25 mg total) by mouth 2 (two) times daily.    Dispense:  180 tablet    Refill:  3  . potassium chloride SA (KLOR-CON M20) 20 MEQ tablet    Sig: Take 1 tablet (20 mEq total) by mouth daily.    Dispense:  90 tablet    Refill:  3    Patient Instructions  Medication Instructions:  Your provider recommends that you continue on your current medications as directed. Please refer to the Current Medication list given to you today.    Labwork: None  Testing/Procedures: None  Follow-Up: Your provider wants you to follow-up in: 1 year with Dr. Excell Seltzer. You will receive a reminder letter in the mail two months in advance. If you don't receive a letter, please call our office to schedule the follow-up appointment.    Any Other Special Instructions Will Be Listed Below (If Applicable).     If you need a refill on your cardiac medications before your next appointment, please call your pharmacy.      Jilda Panda  Excell Seltzer, MD  09/15/2018 5:12 PM    [facility]

## 2018-09-17 ENCOUNTER — Telehealth: Payer: Self-pay | Admitting: *Deleted

## 2018-09-17 NOTE — Telephone Encounter (Signed)
Reviewed chart and called patient. She states Dr. Shirlee Latch told her she would probably have to to take clopidogrel for the rest of her life.    She had a refill in Sep 08, 2017 for 30 days/zero refills but it was discontinued on 09/14/17--the day she saw Dr. Excell Seltzer. Saw Dr. Excell Seltzer again 09/15/2018 - plavix was not on list at that time.  Dr. Earmon Phoenix note:   ESSMENT:    1. Shortness of breath   2. Atherosclerosis of native coronary artery of native heart without angina pectoris   3. Essential hypertension   4. CAD, multiple vessel, residual disease LCX 40% OM   5. Claudication [facility])    PLAN:    In order of problems listed above:  1. We discussed the importance of complete tobacco cessation.  Her physical exam is unremarkable. 2. No anginal symptoms.  She is treated with aspirin, a beta-blocker, and a statin drug. 3. Blood pressure is well controlled on losartan, metoprolol, and furosemide __________________________________________________________________________________  Pt has not ever stopped Plavix.  Has approx 2 weeks left which she is continuing to take.  She is aware that I will forward to Dr. Excell Seltzer - who is out of the office all of next week, as well as his nurse, and someone will call her back.

## 2018-09-17 NOTE — Telephone Encounter (Signed)
Chart reviewed.  Notes from Dr. Shirlee Latch in 2017 and Dr. Excell Seltzer in 2018 suggest long-term DAPT.  I suspect that the medication fell off her list at visit 2 days ago because the prescription expired after 1 year.  I recommend continuing clopidogrel 75 mg daily as long as Melissa Nixon has not had any significant bleeding.  It is okay to refill her prescription at this time.  If Dr. Excell Seltzer feels that clopidogrel should be stopped, this can be readdressed when he is back on the office.  [name], MD [facility] Pager: [phone]

## 2018-09-17 NOTE — Telephone Encounter (Signed)
Patient called and requested a refill on clopidogrel. This is not listed on her current med list as it was removed at her 09/15/18 office visit with Dr Excell Seltzer. Patient stated that she was not aware of any med changes. Please advise. Thanks, MI.

## 2018-09-20 MED ORDER — CLOPIDOGREL BISULFATE 75 MG PO TABS: 75.0000 mg | ORAL_TABLET | Freq: Every day | ORAL | 3 refills | Status: DC

## 2018-09-20 NOTE — Telephone Encounter (Signed)
Called patient and confirmed [facility]. Sent new clopidogrel prescription.  Pt is aware.

## 2018-09-22 DIAGNOSIS — E530 Riboflavin deficiency: Secondary | ICD-10-CM | POA: Diagnosis not present

## 2018-09-29 DIAGNOSIS — E538 Deficiency of other specified B group vitamins: Secondary | ICD-10-CM | POA: Diagnosis not present

## 2018-10-06 DIAGNOSIS — E538 Deficiency of other specified B group vitamins: Secondary | ICD-10-CM | POA: Diagnosis not present

## 2019-08-10 DIAGNOSIS — Z23 Encounter for immunization: Secondary | ICD-10-CM | POA: Diagnosis not present

## 2019-09-29 DIAGNOSIS — E538 Deficiency of other specified B group vitamins: Secondary | ICD-10-CM | POA: Diagnosis not present

## 2019-09-29 DIAGNOSIS — E559 Vitamin D deficiency, unspecified: Secondary | ICD-10-CM | POA: Diagnosis not present

## 2019-09-29 DIAGNOSIS — H9193 Unspecified hearing loss, bilateral: Secondary | ICD-10-CM | POA: Diagnosis not present

## 2019-09-29 DIAGNOSIS — E785 Hyperlipidemia, unspecified: Secondary | ICD-10-CM | POA: Diagnosis not present

## 2019-09-29 DIAGNOSIS — Z79899 Other long term (current) drug therapy: Secondary | ICD-10-CM | POA: Diagnosis not present

## 2019-09-29 DIAGNOSIS — Z1389 Encounter for screening for other disorder: Secondary | ICD-10-CM | POA: Diagnosis not present

## 2019-09-29 DIAGNOSIS — M810 Age-related osteoporosis without current pathological fracture: Secondary | ICD-10-CM | POA: Diagnosis not present

## 2019-09-29 DIAGNOSIS — I2510 Atherosclerotic heart disease of native coronary artery without angina pectoris: Secondary | ICD-10-CM | POA: Diagnosis not present

## 2019-09-29 DIAGNOSIS — Z0000 Encounter for general adult medical examination without abnormal findings: Secondary | ICD-10-CM | POA: Diagnosis not present

## 2019-09-29 DIAGNOSIS — K219 Gastro-esophageal reflux disease without esophagitis: Secondary | ICD-10-CM | POA: Diagnosis not present

## 2019-10-03 ENCOUNTER — Ambulatory Visit: Payer: Medicare HMO | Admitting: Cardiovascular Disease

## 2019-10-03 ENCOUNTER — Encounter: Payer: Self-pay | Admitting: Cardiovascular Disease

## 2019-10-03 ENCOUNTER — Other Ambulatory Visit: Payer: Self-pay

## 2019-10-03 VITALS — BP 128/82 | HR 83 | Ht 66.0 in | Wt 150.4 lb

## 2019-10-03 DIAGNOSIS — E782 Mixed hyperlipidemia: Secondary | ICD-10-CM | POA: Diagnosis not present

## 2019-10-03 DIAGNOSIS — R0602 Shortness of breath: Secondary | ICD-10-CM | POA: Diagnosis not present

## 2019-10-03 DIAGNOSIS — I2510 Atherosclerotic heart disease of native coronary artery without angina pectoris: Secondary | ICD-10-CM | POA: Diagnosis not present

## 2019-10-03 DIAGNOSIS — I10 Essential (primary) hypertension: Secondary | ICD-10-CM | POA: Diagnosis not present

## 2019-10-03 DIAGNOSIS — I739 Peripheral vascular disease, unspecified: Secondary | ICD-10-CM | POA: Diagnosis not present

## 2019-10-03 MED ORDER — FUROSEMIDE 20 MG PO TABS: 20.0000 mg | ORAL_TABLET | Freq: Every day | ORAL | 3 refills | Status: DC

## 2019-10-03 MED ORDER — ATORVASTATIN CALCIUM 40 MG PO TABS: 40.0000 mg | ORAL_TABLET | ORAL | 3 refills | Status: DC

## 2019-10-03 MED ORDER — METOPROLOL TARTRATE 25 MG PO TABS: 25.0000 mg | ORAL_TABLET | Freq: Two times a day (BID) | ORAL | 3 refills | Status: DC

## 2019-10-03 MED ORDER — POTASSIUM CHLORIDE CRYS ER 20 MEQ PO TBCR: 20.0000 meq | EXTENDED_RELEASE_TABLET | Freq: Every day | ORAL | 3 refills | Status: DC

## 2019-10-03 MED ORDER — LOSARTAN POTASSIUM 25 MG PO TABS: 12.5000 mg | ORAL_TABLET | Freq: Every day | ORAL | 3 refills | Status: DC

## 2019-10-03 MED ORDER — NITROGLYCERIN 0.4 MG SL SUBL: 0.4000 mg | SUBLINGUAL_TABLET | SUBLINGUAL | 3 refills | Status: DC | PRN

## 2019-10-03 NOTE — Patient Instructions (Signed)
Medication Instructions:  1) STOP PLAVIX *If you need a refill on your cardiac medications before your next appointment, please call your pharmacy*  Follow-Up: At [facility], you and your health needs are our priority.  As part of our continuing mission to provide you with exceptional heart care, we have created designated Provider Care Teams.  These Care Teams include your primary Cardiologist (physician) and Advanced Practice Providers (APPs -  Physician Assistants and Nurse Practitioners) who all work together to provide you with the care you need, when you need it. Your next appointment:   12 month(s) The format for your next appointment:   In Person Provider:   You may see Sherren Mocha, MD or one of the following Advanced Practice Providers on your designated Care Team:    Richardson Dopp, PA-C  [place], [place]  Daune Perch, [place]

## 2019-10-03 NOTE — Progress Notes (Signed)
Cardiology Office Note:    Date:  10/03/2019   ID:  Melissa Nixon, DOB September 25, 1946, MRN [id number]  PCP:  Melissa Koyanagi, DO  Cardiologist:  Melissa Bollman, MD  Electrophysiologist:  None   Referring MD: Melissa Koyanagi, DO   Chief Complaint  Patient presents with  . Coronary Artery Disease    History of Present Illness:    Melissa Nixon is a 73 y.o. female with a hx of coronary artery disease, initially presented in 2015 with an anterior wall MI treated with primary PCI of the proximal LAD.  LV function was normal on follow-up imaging.  The patient presents today for follow-up evaluation.  She has been doing well from a cardiac perspective.  She denies chest pain, chest pressure, or shortness of breath.  She has had no recent heart palpitations, edema, orthopnea, or PND.  She is compliant with her medicines.  She is now been on aspirin and clopidogrel for the last 5 years since her MI with no bleeding problems.  Past Medical History:  Diagnosis Date  . Allergic rhinitis due to other allergen   . CAD, multiple vessel, residual disease LCX 40% OM 01/22/2014  . Cholecystitis   . ELECTROCARDIOGRAM, ABNORMAL   . GERD   . Hyperglycemia 01/22/2014  . Hyperlipidemia LDL goal < 70 01/22/2014  . HYPERTENSION   . LOW BACK PAIN 01/02/2010   Qualifier: Diagnosis of  By: Yetta Barre MD, Bernadene Bell.   . LV dysfunction, EF at cath 45-50% 01/22/2014  . Murmur, hx VSD, evaluated by cardiology in 2013 06/03/2012  . Osteoporosis, dexa 2014, fosamax, cal, vit D and exercise advised 06/01/2013  . RBBB, chronic 01/22/2014  . STEMI (ST elevation myocardial infarction), 01/20/14, LAD PCI/Stent DES Promus preimer 01/20/2014  . Tobacco use, stopped in 2013 01/22/2014    Past Surgical History:  Procedure Laterality Date  . ABDOMINAL HYSTERECTOMY  1983  . CORONARY ANGIOPLASTY WITH STENT PLACEMENT  01/20/2014   Promus preimer to LAD with STEMI  . LAPAROSCOPIC CHOLECYSTECTOMY    . LEFT HEART CATH N/A 01/20/2014   Procedure: LEFT HEART CATH;  Surgeon: Melissa Bihari, MD;  Location: [facility] CATH LAB;  Service: Cardiovascular;  Laterality: N/A;    Current Medications: Current Meds  Medication Sig  . alendronate (FOSAMAX) 10 MG tablet Take 10 mg daily before breakfast by mouth. Take with a full glass of water on an empty stomach.  . Ascorbic Acid (VITAMIN C PO) Take by mouth daily.  Marland Kitchen aspirin 81 MG tablet Take 81 mg by mouth daily.  Marland Kitchen atorvastatin (LIPITOR) 40 MG tablet Take 1 tablet (40 mg total) by mouth every other day.  . clopidogrel (PLAVIX) 75 MG tablet Take 1 tablet (75 mg total) by mouth daily.  . Cyanocobalamin (VITAMIN B12) 1000 MCG TBCR Take 1 tablet by mouth daily.  . furosemide (LASIX) 20 MG tablet Take 1 tablet (20 mg total) by mouth daily.  . Loratadine-Pseudoephedrine (CLARITIN-D 12 HOUR PO) Take 1 tablet by mouth daily.   Marland Kitchen losartan (COZAAR) 25 MG tablet Take 0.5 tablets (12.5 mg total) by mouth daily.  . metoprolol tartrate (LOPRESSOR) 25 MG tablet Take 1 tablet (25 mg total) by mouth 2 (two) times daily.  . nitroGLYCERIN (NITROSTAT) 0.4 MG SL tablet Place 1 tablet (0.4 mg total) under the tongue every 5 (five) minutes x 3 doses as needed for chest pain.  . pantoprazole (PROTONIX) 20 MG tablet Take 1 tablet (20 mg total) by mouth daily.  Marland Kitchen  potassium chloride SA (KLOR-CON M20) 20 MEQ tablet Take 1 tablet (20 mEq total) by mouth daily.  Marland Kitchen VITAMIN D PO Take by mouth daily.     Allergies:   Brilinta [ticagrelor], Lisinopril, Mushroom extract complex, Other, and Tetracyclines & related   Social History   Socioeconomic History  . Marital status: Married    Spouse name: Not on file  . Number of children: Not on file  . Years of education: Not on file  . Highest education level: Not on file  Occupational History  . Not on file  Social Needs  . Financial resource strain: Not on file  . Food insecurity    Worry: Not on file    Inability: Not on file  . Transportation needs    Medical:  Not on file    Non-medical: Not on file  Tobacco Use  . Smoking status: Former Smoker    Packs/day: 1.00    Types: Cigarettes  . Smokeless tobacco: Never Used  . Tobacco comment: per patient-quit 02/2016  Substance and Sexual Activity  . Alcohol use: No    Alcohol/week: 0.0 standard drinks  . Drug use: No  . Sexual activity: Not on file  Lifestyle  . Physical activity    Days per week: Not on file    Minutes per session: Not on file  . Stress: Not on file  Relationships  . Social Musician on phone: Not on file    Gets together: Not on file    Attends religious service: Not on file    Active member of club or organization: Not on file    Attends meetings of clubs or organizations: Not on file    Relationship status: Not on file  Other Topics Concern  . Not on file  Social History Narrative  . Not on file     Family History: The patient's family history includes Diabetes in her unknown relative; Hypertension in her unknown relative.  ROS:   Please see the history of present illness.    All other systems reviewed and are negative.  EKGs/Labs/Other Studies Reviewed:    EKG:  EKG is ordered today.  The ekg ordered today demonstrates normal sinus rhythm 83 bpm, right bundle branch block, left anterior fascicular block, no significant change from previous tracings.  Recent Labs: No results found for requested labs within last 8760 hours.  Recent Lipid Panel    Component Value Date/Time   CHOL 132 08/21/2016 1024   TRIG 174.0 (H) 08/21/2016 1024   HDL 38.00 (L) 08/21/2016 1024   CHOLHDL 3 08/21/2016 1024   VLDL 34.8 08/21/2016 1024   LDLCALC 59 08/21/2016 1024   LDLDIRECT 138.4 07/16/2012 1345    Physical Exam:    VS:  BP 128/82   Pulse 83   Ht 5\' 6"  (1.676 m)   Wt 150 lb 6.4 oz (68.2 kg)   SpO2 96%   BMI 24.28 kg/m     Wt Readings from Last 3 Encounters:  10/03/19 150 lb 6.4 oz (68.2 kg)  09/15/18 156 lb (70.8 kg)  09/14/17 166 lb 12.8 oz  (75.7 kg)     GEN:  Well nourished, well developed in no acute distress HEENT: Normal NECK: No JVD; No carotid bruits LYMPHATICS: No lymphadenopathy CARDIAC: RRR, no murmurs, rubs, gallops RESPIRATORY:  Clear to auscultation without rales, wheezing or rhonchi  ABDOMEN: Soft, non-tender, non-distended MUSCULOSKELETAL:  No edema; No deformity  SKIN: Warm and dry NEUROLOGIC:  Alert and  oriented x 3 PSYCHIATRIC:  Normal affect   ASSESSMENT:    1. Coronary artery disease involving native coronary artery of native heart without angina pectoris   2. Essential hypertension   3. Mixed hyperlipidemia    PLAN:    In order of problems listed above:  1. The patient is stable without symptoms of angina.  Per guideline recommendations I think she should stop clopidogrel and remain on long-term antiplatelet therapy with aspirin.  She is out greater than 5 years from her MI and continues to do well with no anginal chest pain. 2. Blood pressure is well controlled on losartan and metoprolol 3. Recent labs are reviewed with a cholesterol of 147, HDL 49, LDL 69, and triglycerides 175.  LFTs are normal with an ALT of 15.   Medication Adjustments/Labs and Tests Ordered: Current medicines are reviewed at length with the patient today.  Concerns regarding medicines are outlined above.  No orders of the defined types were placed in this encounter.  No orders of the defined types were placed in this encounter.   There are no Patient Instructions on file for this visit.   Signed, Sherren Mocha, MD  10/03/2019 3:28 PM    [facility]

## 2019-10-04 ENCOUNTER — Other Ambulatory Visit: Payer: Self-pay | Admitting: Internal Medicine

## 2019-10-04 DIAGNOSIS — Z1231 Encounter for screening mammogram for malignant neoplasm of breast: Secondary | ICD-10-CM

## 2019-11-24 ENCOUNTER — Other Ambulatory Visit: Payer: Self-pay

## 2019-11-24 ENCOUNTER — Ambulatory Visit
Admission: RE | Admit: 2019-11-24 | Discharge: 2019-11-24 | Disposition: A | Discharge status: Home / Self Care | Payer: Medicare HMO | Source: Ambulatory Visit | Attending: Internal Medicine | Admitting: Internal Medicine

## 2019-11-24 DIAGNOSIS — Z1231 Encounter for screening mammogram for malignant neoplasm of breast: Secondary | ICD-10-CM

## 2020-01-26 DIAGNOSIS — E785 Hyperlipidemia, unspecified: Secondary | ICD-10-CM | POA: Diagnosis not present

## 2020-01-26 DIAGNOSIS — M810 Age-related osteoporosis without current pathological fracture: Secondary | ICD-10-CM | POA: Diagnosis not present

## 2020-01-26 DIAGNOSIS — I2510 Atherosclerotic heart disease of native coronary artery without angina pectoris: Secondary | ICD-10-CM | POA: Diagnosis not present

## 2020-03-05 DIAGNOSIS — M810 Age-related osteoporosis without current pathological fracture: Secondary | ICD-10-CM | POA: Diagnosis not present

## 2020-03-05 DIAGNOSIS — I2510 Atherosclerotic heart disease of native coronary artery without angina pectoris: Secondary | ICD-10-CM | POA: Diagnosis not present

## 2020-03-05 DIAGNOSIS — E785 Hyperlipidemia, unspecified: Secondary | ICD-10-CM | POA: Diagnosis not present

## 2020-07-25 DIAGNOSIS — Z23 Encounter for immunization: Secondary | ICD-10-CM | POA: Diagnosis not present

## 2020-07-30 DIAGNOSIS — I2510 Atherosclerotic heart disease of native coronary artery without angina pectoris: Secondary | ICD-10-CM | POA: Diagnosis not present

## 2020-07-30 DIAGNOSIS — E785 Hyperlipidemia, unspecified: Secondary | ICD-10-CM | POA: Diagnosis not present

## 2020-07-30 DIAGNOSIS — M810 Age-related osteoporosis without current pathological fracture: Secondary | ICD-10-CM | POA: Diagnosis not present

## 2020-09-23 ENCOUNTER — Other Ambulatory Visit: Payer: Self-pay | Admitting: Cardiovascular Disease

## 2020-09-23 DIAGNOSIS — I739 Peripheral vascular disease, unspecified: Secondary | ICD-10-CM

## 2020-09-23 DIAGNOSIS — R0602 Shortness of breath: Secondary | ICD-10-CM

## 2020-09-23 DIAGNOSIS — I2510 Atherosclerotic heart disease of native coronary artery without angina pectoris: Secondary | ICD-10-CM

## 2020-09-23 DIAGNOSIS — I10 Essential (primary) hypertension: Secondary | ICD-10-CM

## 2020-10-29 DIAGNOSIS — Z79899 Other long term (current) drug therapy: Secondary | ICD-10-CM | POA: Diagnosis not present

## 2020-10-29 DIAGNOSIS — E785 Hyperlipidemia, unspecified: Secondary | ICD-10-CM | POA: Diagnosis not present

## 2020-10-29 DIAGNOSIS — M810 Age-related osteoporosis without current pathological fracture: Secondary | ICD-10-CM | POA: Diagnosis not present

## 2020-10-29 DIAGNOSIS — Z720 Tobacco use: Secondary | ICD-10-CM | POA: Diagnosis not present

## 2020-10-29 DIAGNOSIS — Z1239 Encounter for other screening for malignant neoplasm of breast: Secondary | ICD-10-CM | POA: Diagnosis not present

## 2020-10-29 DIAGNOSIS — K219 Gastro-esophageal reflux disease without esophagitis: Secondary | ICD-10-CM | POA: Diagnosis not present

## 2020-10-29 DIAGNOSIS — Z0001 Encounter for general adult medical examination with abnormal findings: Secondary | ICD-10-CM | POA: Diagnosis not present

## 2020-10-29 DIAGNOSIS — H9193 Unspecified hearing loss, bilateral: Secondary | ICD-10-CM | POA: Diagnosis not present

## 2020-10-29 DIAGNOSIS — I2510 Atherosclerotic heart disease of native coronary artery without angina pectoris: Secondary | ICD-10-CM | POA: Diagnosis not present

## 2020-10-29 DIAGNOSIS — E538 Deficiency of other specified B group vitamins: Secondary | ICD-10-CM | POA: Diagnosis not present

## 2020-10-29 DIAGNOSIS — E559 Vitamin D deficiency, unspecified: Secondary | ICD-10-CM | POA: Diagnosis not present

## 2020-11-02 ENCOUNTER — Encounter: Payer: Self-pay | Admitting: Cardiovascular Disease

## 2020-11-02 ENCOUNTER — Other Ambulatory Visit: Payer: Self-pay

## 2020-11-02 ENCOUNTER — Ambulatory Visit: Payer: Medicare HMO | Admitting: Cardiovascular Disease

## 2020-11-02 VITALS — BP 126/82 | HR 75 | Ht 67.0 in | Wt 150.4 lb

## 2020-11-02 DIAGNOSIS — E782 Mixed hyperlipidemia: Secondary | ICD-10-CM

## 2020-11-02 DIAGNOSIS — I2510 Atherosclerotic heart disease of native coronary artery without angina pectoris: Secondary | ICD-10-CM

## 2020-11-02 DIAGNOSIS — I10 Essential (primary) hypertension: Secondary | ICD-10-CM | POA: Diagnosis not present

## 2020-11-02 DIAGNOSIS — R0602 Shortness of breath: Secondary | ICD-10-CM | POA: Diagnosis not present

## 2020-11-02 MED ORDER — NITROGLYCERIN 0.4 MG SL SUBL: 0.4000 mg | SUBLINGUAL_TABLET | SUBLINGUAL | 3 refills | Status: DC | PRN

## 2020-11-02 NOTE — Patient Instructions (Signed)
Medication Instructions:  1) STOP LOSARTAN *If you need a refill on your cardiac medications before your next appointment, please call your pharmacy*  Follow-Up: At [facility], you and your health needs are our priority.  As part of our continuing mission to provide you with exceptional heart care, we have created designated Provider Care Teams.  These Care Teams include your primary Cardiologist (physician) and Advanced Practice Providers (APPs -  Physician Assistants and Nurse Practitioners) who all work together to provide you with the care you need, when you need it. Your next appointment:   12 month(s) The format for your next appointment:   In Person Provider:   You may see Michael Cooper, MD or one of the following Advanced Practice Providers on your designated Care Team:    Scott Weaver, PA-C  Vin Bhagat, PA-C   

## 2020-11-02 NOTE — Progress Notes (Signed)
Cardiology Office Note:    Date:  11/02/2020   ID:  Melissa Nixon, DOB 1946-07-14, MRN [id number]  PCP:  Marden Noble, MD  [facility] Cardiologist:  Tonny Bollman, MD  [facility] Electrophysiologist:  None   Referring MD: Marden Noble, MD   Chief Complaint  Patient presents with  . Coronary Artery Disease    History of Present Illness:    Melissa Nixon is a 75 y.o. female with a hx of coronary artery disease, presenting for follow-up evaluation.  The patient presented in 2015 with an anterior wall MI treated with primary PCI of the proximal LAD with a drug-eluting stent.  LV function was normal on follow-up imaging.  The patient is here alone today.  She is doing well with no symptoms of substernal chest pain or pressure, dyspnea, orthopnea, PND, edema, or heart palpitations.  She does complain of dizziness with postural change.  After she stands up for a few minutes her symptoms go away.  She has not had presyncope or frank syncope.  Past Medical History:  Diagnosis Date  . Allergic rhinitis due to other allergen   . CAD, multiple vessel, residual disease LCX 40% OM 01/22/2014  . Cholecystitis   . ELECTROCARDIOGRAM, ABNORMAL   . GERD   . Hyperglycemia 01/22/2014  . Hyperlipidemia LDL goal < 70 01/22/2014  . HYPERTENSION   . LOW BACK PAIN 01/02/2010   Qualifier: Diagnosis of  By: Yetta Barre MD, Bernadene Bell.   . LV dysfunction, EF at cath 45-50% 01/22/2014  . Murmur, hx VSD, evaluated by cardiology in 2013 06/03/2012  . Osteoporosis, dexa 2014, fosamax, cal, vit D and exercise advised 06/01/2013  . RBBB, chronic 01/22/2014  . STEMI (ST elevation myocardial infarction), 01/20/14, LAD PCI/Stent DES Promus preimer 01/20/2014  . Tobacco use, stopped in 2013 01/22/2014    Past Surgical History:  Procedure Laterality Date  . ABDOMINAL HYSTERECTOMY  1983  . CORONARY ANGIOPLASTY WITH STENT PLACEMENT  01/20/2014   Promus preimer to LAD with STEMI  . LAPAROSCOPIC CHOLECYSTECTOMY     . LEFT HEART CATH N/A 01/20/2014   Procedure: LEFT HEART CATH;  Surgeon: Lennette Bihari, MD;  Location: [facility] CATH LAB;  Service: Cardiovascular;  Laterality: N/A;    Current Medications: Current Meds  Medication Sig  . alendronate (FOSAMAX) 10 MG tablet Take 10 mg by mouth daily before breakfast. Take with a full glass of water on an empty stomach.  Marland Kitchen aspirin 81 MG tablet Take 81 mg by mouth daily.  Marland Kitchen atorvastatin (LIPITOR) 40 MG tablet TAKE 1 TABLET EVERY OTHER DAY  . Cyanocobalamin (VITAMIN B12) 1000 MCG TBCR Take 1 tablet by mouth daily.  . furosemide (LASIX) 20 MG tablet TAKE 1 TABLET EVERY DAY  . Loratadine-Pseudoephedrine (CLARITIN-D 12 HOUR PO) Take 1 tablet by mouth daily.   . metoprolol tartrate (LOPRESSOR) 25 MG tablet TAKE 1 TABLET TWICE DAILY  . nitroGLYCERIN (NITROSTAT) 0.4 MG SL tablet Place 1 tablet (0.4 mg total) under the tongue every 5 (five) minutes x 3 doses as needed for chest pain.  . pantoprazole (PROTONIX) 20 MG tablet Take 1 tablet (20 mg total) by mouth daily.  . potassium chloride SA (KLOR-CON) 20 MEQ tablet TAKE 1 TABLET (20 MEQ TOTAL) BY MOUTH DAILY.  Marland Kitchen VITAMIN D PO Take by mouth daily.  . [DISCONTINUED] losartan (COZAAR) 25 MG tablet TAKE 1/2 TABLET EVERY DAY     Allergies:   Brilinta [ticagrelor], Lisinopril, Mushroom extract complex, Other, and Tetracyclines & related  Social History   Socioeconomic History  . Marital status: Married    Spouse name: Not on file  . Number of children: Not on file  . Years of education: Not on file  . Highest education level: Not on file  Occupational History  . Not on file  Tobacco Use  . Smoking status: Former Smoker    Packs/day: 1.00    Types: Cigarettes  . Smokeless tobacco: Never Used  . Tobacco comment: per patient-quit 02/2016  Substance and Sexual Activity  . Alcohol use: No    Alcohol/week: 0.0 standard drinks  . Drug use: No  . Sexual activity: Not on file  Other Topics Concern  . Not on file   Social History Narrative  . Not on file   Social Determinants of Health   Financial Resource Strain: Not on file  Food Insecurity: Not on file  Transportation Needs: Not on file  Physical Activity: Not on file  Stress: Not on file  Social Connections: Not on file     Family History: The patient's family history includes Diabetes in an other family member; Hypertension in an other family member.  ROS:   Please see the history of present illness.    All other systems reviewed and are negative.  EKGs/Labs/Other Studies Reviewed:    EKG:  EKG is ordered today.  The ekg ordered today demonstrates normal sinus rhythm 75 bpm, right bundle branch block, left anterior fascicular block, no changes from last tracing 10/03/2019  Recent Labs: No results found for requested labs within last 8760 hours.  Recent Lipid Panel    Component Value Date/Time   CHOL 132 08/21/2016 1024   TRIG 174.0 (H) 08/21/2016 1024   HDL 38.00 (L) 08/21/2016 1024   CHOLHDL 3 08/21/2016 1024   VLDL 34.8 08/21/2016 1024   LDLCALC 59 08/21/2016 1024   LDLDIRECT 138.4 07/16/2012 1345     Risk Assessment/Calculations:       Physical Exam:    VS:  BP 126/82   Pulse 75   Ht 5\' 7"  (1.702 m)   Wt 150 lb 6.4 oz (68.2 kg)   SpO2 95%   BMI 23.56 kg/m     Wt Readings from Last 3 Encounters:  11/02/20 150 lb 6.4 oz (68.2 kg)  10/03/19 150 lb 6.4 oz (68.2 kg)  09/15/18 156 lb (70.8 kg)     GEN:  Well nourished, well developed in no acute distress HEENT: Normal NECK: No JVD; No carotid bruits LYMPHATICS: No lymphadenopathy CARDIAC: RRR, no murmurs, rubs, gallops RESPIRATORY:  Clear to auscultation without rales, wheezing or rhonchi  ABDOMEN: Soft, non-tender, non-distended MUSCULOSKELETAL:  No edema; No deformity  SKIN: Warm and dry NEUROLOGIC:  Alert and oriented x 3 PSYCHIATRIC:  Normal affect   ASSESSMENT:    1. Essential hypertension   2. Coronary artery disease involving native coronary  artery of native heart without angina pectoris   3. Mixed hyperlipidemia    PLAN:    In order of problems listed above:  1. Having problems with postural dizziness.  Reviewed medications.  With normal LV function, recommended discontinuation of low-dose losartan.  Continue other medicines without change.  Advised to drink plenty of fluids. 2. No anginal symptoms.  Nitroglycerin is refilled as she likes to have this on hand.  Continue aspirin for antiplatelet therapy.  Clopidogrel was discontinued 1 year ago.  Continue high intensity statin drug. 3. Reviewed lipids which are excellent.  She is treated with atorvastatin 40 mg daily.  LDL cholesterol is 63.  ALT is normal at 14.  Medication Adjustments/Labs and Tests Ordered: Current medicines are reviewed at length with the patient today.  Concerns regarding medicines are outlined above.  Orders Placed This Encounter  Procedures  . EKG 12-Lead   No orders of the defined types were placed in this encounter.   Patient Instructions  Medication Instructions:  1) STOP LOSARTAN *If you need a refill on your cardiac medications before your next appointment, please call your pharmacy*   Follow-Up: At [facility], you and your health needs are our priority.  As part of our continuing mission to provide you with exceptional heart care, we have created designated Provider Care Teams.  These Care Teams include your primary Cardiologist (physician) and Advanced Practice Providers (APPs -  Physician Assistants and Nurse Practitioners) who all work together to provide you with the care you need, when you need it. Your next appointment:   12 month(s) The format for your next appointment:   In Person Provider:   You may see Sherren Mocha, MD or one of the following Advanced Practice Providers on your designated Care Team:    Richardson Dopp, PA-C  Robbie Lis, [place]      Signed, Sherren Mocha, MD  11/02/2020 3:27 PM    [employer]

## 2020-11-06 DIAGNOSIS — M810 Age-related osteoporosis without current pathological fracture: Secondary | ICD-10-CM | POA: Diagnosis not present

## 2020-11-06 DIAGNOSIS — I2510 Atherosclerotic heart disease of native coronary artery without angina pectoris: Secondary | ICD-10-CM | POA: Diagnosis not present

## 2020-11-06 DIAGNOSIS — K219 Gastro-esophageal reflux disease without esophagitis: Secondary | ICD-10-CM | POA: Diagnosis not present

## 2020-11-06 DIAGNOSIS — E785 Hyperlipidemia, unspecified: Secondary | ICD-10-CM | POA: Diagnosis not present

## 2020-11-08 ENCOUNTER — Other Ambulatory Visit: Payer: Self-pay | Admitting: Internal Medicine

## 2020-11-08 DIAGNOSIS — Z720 Tobacco use: Secondary | ICD-10-CM

## 2020-12-17 ENCOUNTER — Other Ambulatory Visit: Payer: Medicare HMO

## 2021-01-28 DIAGNOSIS — M810 Age-related osteoporosis without current pathological fracture: Secondary | ICD-10-CM | POA: Diagnosis not present

## 2021-01-28 DIAGNOSIS — I2510 Atherosclerotic heart disease of native coronary artery without angina pectoris: Secondary | ICD-10-CM | POA: Diagnosis not present

## 2021-01-28 DIAGNOSIS — E785 Hyperlipidemia, unspecified: Secondary | ICD-10-CM | POA: Diagnosis not present

## 2021-01-28 DIAGNOSIS — K219 Gastro-esophageal reflux disease without esophagitis: Secondary | ICD-10-CM | POA: Diagnosis not present

## 2021-04-25 DIAGNOSIS — H2513 Age-related nuclear cataract, bilateral: Secondary | ICD-10-CM | POA: Diagnosis not present

## 2021-05-02 DIAGNOSIS — I2510 Atherosclerotic heart disease of native coronary artery without angina pectoris: Secondary | ICD-10-CM | POA: Diagnosis not present

## 2021-05-02 DIAGNOSIS — M810 Age-related osteoporosis without current pathological fracture: Secondary | ICD-10-CM | POA: Diagnosis not present

## 2021-05-02 DIAGNOSIS — K219 Gastro-esophageal reflux disease without esophagitis: Secondary | ICD-10-CM | POA: Diagnosis not present

## 2021-05-02 DIAGNOSIS — E785 Hyperlipidemia, unspecified: Secondary | ICD-10-CM | POA: Diagnosis not present

## 2021-05-16 DIAGNOSIS — Z0100 Encounter for examination of eyes and vision without abnormal findings: Secondary | ICD-10-CM | POA: Diagnosis not present

## 2021-09-28 ENCOUNTER — Other Ambulatory Visit: Payer: Self-pay | Admitting: Cardiovascular Disease

## 2021-09-28 DIAGNOSIS — I2510 Atherosclerotic heart disease of native coronary artery without angina pectoris: Secondary | ICD-10-CM

## 2021-09-28 DIAGNOSIS — I739 Peripheral vascular disease, unspecified: Secondary | ICD-10-CM

## 2021-09-28 DIAGNOSIS — I10 Essential (primary) hypertension: Secondary | ICD-10-CM

## 2021-09-28 DIAGNOSIS — R0602 Shortness of breath: Secondary | ICD-10-CM

## 2021-10-31 ENCOUNTER — Other Ambulatory Visit: Payer: Self-pay | Admitting: Internal Medicine

## 2021-10-31 DIAGNOSIS — I2510 Atherosclerotic heart disease of native coronary artery without angina pectoris: Secondary | ICD-10-CM | POA: Diagnosis not present

## 2021-10-31 DIAGNOSIS — I10 Essential (primary) hypertension: Secondary | ICD-10-CM | POA: Diagnosis not present

## 2021-10-31 DIAGNOSIS — M810 Age-related osteoporosis without current pathological fracture: Secondary | ICD-10-CM

## 2021-10-31 DIAGNOSIS — Z23 Encounter for immunization: Secondary | ICD-10-CM | POA: Diagnosis not present

## 2021-10-31 DIAGNOSIS — Z720 Tobacco use: Secondary | ICD-10-CM | POA: Diagnosis not present

## 2021-10-31 DIAGNOSIS — Z79899 Other long term (current) drug therapy: Secondary | ICD-10-CM | POA: Diagnosis not present

## 2021-10-31 DIAGNOSIS — H9193 Unspecified hearing loss, bilateral: Secondary | ICD-10-CM | POA: Diagnosis not present

## 2021-10-31 DIAGNOSIS — E538 Deficiency of other specified B group vitamins: Secondary | ICD-10-CM | POA: Diagnosis not present

## 2021-10-31 DIAGNOSIS — K219 Gastro-esophageal reflux disease without esophagitis: Secondary | ICD-10-CM | POA: Diagnosis not present

## 2021-10-31 DIAGNOSIS — E559 Vitamin D deficiency, unspecified: Secondary | ICD-10-CM | POA: Diagnosis not present

## 2021-10-31 DIAGNOSIS — E785 Hyperlipidemia, unspecified: Secondary | ICD-10-CM | POA: Diagnosis not present

## 2021-10-31 DIAGNOSIS — Z1231 Encounter for screening mammogram for malignant neoplasm of breast: Secondary | ICD-10-CM

## 2021-10-31 DIAGNOSIS — Z0001 Encounter for general adult medical examination with abnormal findings: Secondary | ICD-10-CM | POA: Diagnosis not present

## 2021-11-12 ENCOUNTER — Encounter: Payer: Self-pay | Admitting: Cardiovascular Disease

## 2021-11-12 ENCOUNTER — Ambulatory Visit: Payer: Medicare HMO | Admitting: Cardiovascular Disease

## 2021-11-12 ENCOUNTER — Other Ambulatory Visit: Payer: Self-pay

## 2021-11-12 VITALS — BP 130/90 | HR 75 | Ht 67.0 in | Wt 152.2 lb

## 2021-11-12 DIAGNOSIS — Z720 Tobacco use: Secondary | ICD-10-CM

## 2021-11-12 DIAGNOSIS — I10 Essential (primary) hypertension: Secondary | ICD-10-CM

## 2021-11-12 DIAGNOSIS — E782 Mixed hyperlipidemia: Secondary | ICD-10-CM

## 2021-11-12 DIAGNOSIS — I2510 Atherosclerotic heart disease of native coronary artery without angina pectoris: Secondary | ICD-10-CM

## 2021-11-12 NOTE — Progress Notes (Signed)
Cardiology Office Note:    Date:  11/13/2021   ID:  Melissa Nixon, DOB [birth year], MRN [id number]  PCP:  Marden Noble, MD   [facility] Providers Cardiologist:  Tonny Bollman, MD     Referring MD: Marden Noble, MD   Chief Complaint  Patient presents with   Coronary Artery Disease    History of Present Illness:    Melissa Nixon is a 76 y.o. female with a hx of coronary artery disease, presenting for follow-up evaluation.  The patient presented in 2015 with an anterior wall MI treated with primary PCI of the proximal LAD with a drug-eluting stent.  LV function was normal on follow-up imaging.  The patient is here alone today.  She is doing well.  She denies any chest pain, chest pressure, shortness of breath, edema, or heart palpitations.  She is smoking 2 cigarettes/day.  She notes that her husband is a heavy smoker.  The patient is compliant with her medicines and has no specific cardiac-related complaints today.  Past Medical History:  Diagnosis Date   Allergic rhinitis due to other allergen    CAD, multiple vessel, residual disease LCX 40% OM 01/22/2014   Cholecystitis    ELECTROCARDIOGRAM, ABNORMAL    GERD    Hyperglycemia 01/22/2014   Hyperlipidemia LDL goal < 70 01/22/2014   HYPERTENSION    LOW BACK PAIN 01/02/2010   Qualifier: Diagnosis of  By: Yetta Barre MD, Bernadene Bell.    LV dysfunction, EF at cath 45-50% 01/22/2014   Murmur, hx VSD, evaluated by cardiology in 2013 06/03/2012   Osteoporosis, dexa 2014, fosamax, cal, vit D and exercise advised 06/01/2013   RBBB, chronic 01/22/2014   STEMI (ST elevation myocardial infarction), 01/20/14, LAD PCI/Stent DES Promus preimer 01/20/2014   Tobacco use, stopped in 2013 01/22/2014    Past Surgical History:  Procedure Laterality Date   ABDOMINAL HYSTERECTOMY  1983   CORONARY ANGIOPLASTY WITH STENT PLACEMENT  01/20/2014   Promus preimer to LAD with STEMI   LAPAROSCOPIC CHOLECYSTECTOMY     LEFT HEART CATH N/A 01/20/2014    Procedure: LEFT HEART CATH;  Surgeon: Lennette Bihari, MD;  Location: [facility] CATH LAB;  Service: Cardiovascular;  Laterality: N/A;    Current Medications: Current Meds  Medication Sig   alendronate (FOSAMAX) 10 MG tablet Take 10 mg by mouth daily before breakfast. Take with a full glass of water on an empty stomach.   aspirin 81 MG tablet Take 81 mg by mouth daily.   atorvastatin (LIPITOR) 40 MG tablet TAKE 1 TABLET EVERY OTHER DAY   Cyanocobalamin (VITAMIN B12) 1000 MCG TBCR Take 1 tablet by mouth every other day.   furosemide (LASIX) 20 MG tablet TAKE 1 TABLET EVERY DAY   Loratadine-Pseudoephedrine (CLARITIN-D 12 HOUR PO) Take 1 tablet by mouth daily.    metoprolol tartrate (LOPRESSOR) 25 MG tablet TAKE 1 TABLET TWICE DAILY   nitroGLYCERIN (NITROSTAT) 0.4 MG SL tablet Place 1 tablet (0.4 mg total) under the tongue every 5 (five) minutes x 3 doses as needed for chest pain.   pantoprazole (PROTONIX) 20 MG tablet Take 1 tablet (20 mg total) by mouth daily.   potassium chloride SA (KLOR-CON M) 20 MEQ tablet TAKE 1 TABLET EVERY DAY   VITAMIN D PO Take by mouth daily.     Allergies:   Brilinta [ticagrelor], Lisinopril, Mushroom extract complex, Other, and Tetracyclines & related   Social History   Socioeconomic History   Marital status: Married    Spouse  name: Not on file   Number of children: Not on file   Years of education: Not on file   Highest education level: Not on file  Occupational History   Not on file  Tobacco Use   Smoking status: Former    Packs/day: 1.00    Types: Cigarettes   Smokeless tobacco: Never   Tobacco comments:    per patient-quit 02/2016  Substance and Sexual Activity   Alcohol use: No    Alcohol/week: 0.0 standard drinks   Drug use: No   Sexual activity: Not on file  Other Topics Concern   Not on file  Social History Narrative   Not on file   Social Determinants of Health   Financial Resource Strain: Not on file  Food Insecurity: Not on file   Transportation Needs: Not on file  Physical Activity: Not on file  Stress: Not on file  Social Connections: Not on file     Family History: The patient's family history includes Diabetes in an other family member; Hypertension in an other family member.  ROS:   Please see the history of present illness.    All other systems reviewed and are negative.  EKGs/Labs/Other Studies Reviewed:    The following studies were reviewed today: Echo 09/25/2014: - Left ventricle: The cavity size was normal. Wall thickness was    increased in a pattern of mild LVH. Systolic function was normal.    The estimated ejection fraction was in the range of 60% to 65%.    Although no diagnostic regional wall motion abnormality was    identified, this possibility cannot be completely excluded on the    basis of this study. Doppler parameters are consistent with    abnormal left ventricular relaxation (grade 1 diastolic    dysfunction).  - Aortic valve: There was no stenosis.  - Mitral valve: There was trivial regurgitation.  - Right ventricle: The cavity size was normal. Systolic function    was normal.  - Tricuspid valve: Peak RV-RA gradient (S): 19 mm Hg.  - Pulmonary arteries: PA peak pressure: 22 mm Hg (S).  - Inferior vena cava: The vessel was normal in size. The    respirophasic diameter changes were in the normal range (>= 50%),    consistent with normal central venous pressure.   EKG:  EKG is ordered today.  The ekg ordered today demonstrates NSR, LAFB, no change compared with prior studies  Recent Labs: No results found for requested labs within last 8760 hours.  Recent Lipid Panel    Component Value Date/Time   CHOL 132 08/21/2016 1024   TRIG 174.0 (H) 08/21/2016 1024   HDL 38.00 (L) 08/21/2016 1024   CHOLHDL 3 08/21/2016 1024   VLDL 34.8 08/21/2016 1024   LDLCALC 59 08/21/2016 1024   LDLDIRECT 138.4 07/16/2012 1345     Risk Assessment/Calculations:           Physical Exam:     VS:  BP 130/90    Pulse 75    Ht 5\' 7"  (1.702 m)    Wt 152 lb 3.2 oz (69 kg)    SpO2 96%    BMI 23.84 kg/m     Wt Readings from Last 3 Encounters:  11/12/21 152 lb 3.2 oz (69 kg)  11/02/20 150 lb 6.4 oz (68.2 kg)  10/03/19 150 lb 6.4 oz (68.2 kg)     GEN:  Well nourished, well developed in no acute distress HEENT: Normal NECK: No JVD; No carotid  bruits LYMPHATICS: No lymphadenopathy CARDIAC: RRR, no murmurs, rubs, gallops RESPIRATORY:  Clear to auscultation without rales, wheezing or rhonchi  ABDOMEN: Soft, non-tender, non-distended MUSCULOSKELETAL:  No edema; No deformity  SKIN: Warm and dry NEUROLOGIC:  Alert and oriented x 3 PSYCHIATRIC:  Normal affect   ASSESSMENT:    1. Coronary artery disease involving native coronary artery of native heart without angina pectoris   2. Essential hypertension   3. Mixed hyperlipidemia   4. Tobacco abuse    PLAN:    In order of problems listed above:  Stable on aspirin, metoprolol, and a statin drug.  No anginal symptoms. Blood pressure is controlled on metoprolol. LDL cholesterol 59 mg/dL on recent labs.  Continue atorvastatin 40 mg daily. Smoking cessation counseling done.      Plan follow-up 1 year unless problems arise in the interim.   Medication Adjustments/Labs and Tests Ordered: Current medicines are reviewed at length with the patient today.  Concerns regarding medicines are outlined above.  Orders Placed This Encounter  Procedures   EKG 12-Lead   No orders of the defined types were placed in this encounter.   Patient Instructions  Medication Instructions:  NONE *If you need a refill on your cardiac medications before your next appointment, please call your pharmacy*   Lab Work: NONE If you have labs (blood work) drawn today and your tests are completely normal, you will receive your results only by: MyChart Message (if you have MyChart) OR A paper copy in the mail If you have any lab test that is  abnormal or we need to change your treatment, we will call you to review the results.   Testing/Procedures: NONE   Follow-Up: At [facility], you and your health needs are our priority.  As part of our continuing mission to provide you with exceptional heart care, we have created designated Provider Care Teams.  These Care Teams include your primary Cardiologist (physician) and Advanced Practice Providers (APPs -  Physician Assistants and Nurse Practitioners) who all work together to provide you with the care you need, when you need it.  Your next appointment:   1 year(s)  The format for your next appointment:   In Person  Provider:   Tonny Bollman, MD      Signed, Tonny Bollman, MD  11/13/2021 7:32 AM    [facility]

## 2021-11-12 NOTE — Patient Instructions (Signed)
Medication Instructions:  °NONE °*If you need a refill on your cardiac medications before your next appointment, please call your pharmacy* ° ° °Lab Work: °NONE °If you have labs (blood work) drawn today and your tests are completely normal, you will receive your results only by: °MyChart Message (if you have MyChart) OR °A paper copy in the mail °If you have any lab test that is abnormal or we need to change your treatment, we will call you to review the results. ° ° °Testing/Procedures: °NONE ° ° °Follow-Up: °At [facility], you and your health needs are our priority.  As part of our continuing mission to provide you with exceptional heart care, we have created designated Provider Care Teams.  These Care Teams include your primary Cardiologist (physician) and Advanced Practice Providers (APPs -  Physician Assistants and Nurse Practitioners) who all work together to provide you with the care you need, when you need it. ° °Your next appointment:   °1 year(s) ° °The format for your next appointment:   °In Person ° °Provider:   °Michael Cooper, MD   ° °  °

## 2022-03-25 ENCOUNTER — Ambulatory Visit
Admission: RE | Admit: 2022-03-25 | Discharge: 2022-03-25 | Disposition: A | Discharge status: Home / Self Care | Payer: Medicare HMO | Source: Ambulatory Visit | Attending: Internal Medicine | Admitting: Internal Medicine

## 2022-03-25 DIAGNOSIS — Z1231 Encounter for screening mammogram for malignant neoplasm of breast: Secondary | ICD-10-CM | POA: Diagnosis not present

## 2022-03-25 DIAGNOSIS — Z780 Asymptomatic menopausal state: Secondary | ICD-10-CM | POA: Diagnosis not present

## 2022-03-25 DIAGNOSIS — M810 Age-related osteoporosis without current pathological fracture: Secondary | ICD-10-CM | POA: Diagnosis not present

## 2022-08-09 DIAGNOSIS — Z23 Encounter for immunization: Secondary | ICD-10-CM | POA: Diagnosis not present

## 2022-11-10 ENCOUNTER — Encounter: Payer: Self-pay | Admitting: Cardiovascular Disease

## 2022-11-10 ENCOUNTER — Ambulatory Visit: Payer: Medicare HMO | Attending: Cardiovascular Disease | Admitting: Cardiovascular Disease

## 2022-11-10 VITALS — BP 128/78 | HR 72 | Ht 66.0 in | Wt 144.0 lb

## 2022-11-10 DIAGNOSIS — E782 Mixed hyperlipidemia: Secondary | ICD-10-CM

## 2022-11-10 DIAGNOSIS — I10 Essential (primary) hypertension: Secondary | ICD-10-CM | POA: Diagnosis not present

## 2022-11-10 DIAGNOSIS — M79604 Pain in right leg: Secondary | ICD-10-CM

## 2022-11-10 DIAGNOSIS — I2510 Atherosclerotic heart disease of native coronary artery without angina pectoris: Secondary | ICD-10-CM | POA: Diagnosis not present

## 2022-11-10 DIAGNOSIS — Z720 Tobacco use: Secondary | ICD-10-CM | POA: Diagnosis not present

## 2022-11-10 NOTE — Patient Instructions (Signed)
Medication Instructions:  Your physician recommends that you continue on your current medications as directed. Please refer to the Current Medication list given to you today.  *If you need a refill on your cardiac medications before your next appointment, please call your pharmacy*   Lab Work: CBC, CMET, Lipids (this week) If you have labs (blood work) drawn today and your tests are completely normal, you will receive your results only by: [place] (if you have MyChart) OR A paper copy in the mail If you have any lab test that is abnormal or we need to change your treatment, we will call you to review the results.   Testing/Procedures: Ankle Brachial Index Your physician has requested that you have an ankle brachial index (ABI). During this test an ultrasound and blood pressure cuff are used to evaluate the arteries that supply the arms and legs with blood. Allow thirty minutes for this exam. There are no restrictions or special instructions.  Follow-Up: At [facility], you and your health needs are our priority.  As part of our continuing mission to provide you with exceptional heart care, we have created designated Provider Care Teams.  These Care Teams include your primary Cardiologist (physician) and Advanced Practice Providers (APPs -  Physician Assistants and Nurse Practitioners) who all work together to provide you with the care you need, when you need it.  We recommend signing up for the patient portal called "MyChart".  Sign up information is provided on this After Visit Summary.  MyChart is used to connect with patients for Virtual Visits (Telemedicine).  Patients are able to view lab/test results, encounter notes, upcoming appointments, etc.  Non-urgent messages can be sent to your provider as well.   To learn more about what you can do with MyChart, go to [email or web address].    Your next appointment:   1 year(s)  Provider:   Sherren Mocha, MD

## 2022-11-10 NOTE — Progress Notes (Unsigned)
Cardiology Office Note:    Date:  11/10/2022   ID:  Melissa Nixon, DOB November 26, 1945, MRN [id number]  PCP:  Josetta Huddle, [place] Providers Cardiologist:  Sherren Mocha, MD     Referring MD: Josetta Huddle, MD   Chief Complaint  Patient presents with   Coronary Artery Disease    History of Present Illness:    Melissa Nixon is a 77 y.o. female with a hx of coronary artery disease, presenting for follow-up evaluation.  The patient presented in 2015 with an anterior wall MI treated with primary PCI of the proximal LAD with a drug-eluting stent.  LV function was normal on follow-up imaging.     The patient is here alone today.  She has been doing okay from a cardiac perspective.  She denies chest pain, chest pressure or shortness of breath.  No palpitations, orthopnea, PND, or leg swelling.  She has developed some discomfort in the upper leg on the right side going down into the lower portion of the leg.  States that at times it feels like a cramping pain.  Not typical for claudication.  Otherwise no specific complaints today.  Past Medical History:  Diagnosis Date   Allergic rhinitis due to other allergen    CAD, multiple vessel, residual disease LCX 40% OM 01/22/2014   Cholecystitis    ELECTROCARDIOGRAM, ABNORMAL    GERD    Hyperglycemia 01/22/2014   Hyperlipidemia LDL goal < 70 01/22/2014   HYPERTENSION    LOW BACK PAIN 01/02/2010   Qualifier: Diagnosis of  By: Ronnald Ramp MD, Arvid Right.    LV dysfunction, EF at cath 45-50% 01/22/2014   Murmur, hx VSD, evaluated by cardiology in 2013 06/03/2012   Osteoporosis, dexa 2014, fosamax, cal, vit D and exercise advised 06/01/2013   RBBB, chronic 01/22/2014   STEMI (ST elevation myocardial infarction), 01/20/14, LAD PCI/Stent DES Promus preimer 01/20/2014   Tobacco use, stopped in 2013 01/22/2014    Past Surgical History:  Procedure Laterality Date   [place] WITH STENT  PLACEMENT  01/20/2014   Promus preimer to LAD with STEMI   LAPAROSCOPIC CHOLECYSTECTOMY     LEFT HEART CATH N/A 01/20/2014   Procedure: LEFT HEART CATH;  Surgeon: Troy Sine, MD;  Location: [facility] CATH LAB;  Service: Cardiovascular;  Laterality: N/A;    Current Medications: Current Meds  Medication Sig   alendronate (FOSAMAX) 10 MG tablet Take 10 mg by mouth daily before breakfast. Take with a full glass of water on an empty stomach.   aspirin 81 MG tablet Take 81 mg by mouth daily.   atorvastatin (LIPITOR) 40 MG tablet TAKE 1 TABLET EVERY OTHER DAY   Calcium Carbonate-Vit D-Min (CALCIUM 1200 PO) Take by mouth daily at 6 (six) AM.   Cyanocobalamin (VITAMIN B12) 1000 MCG TBCR Take 1 tablet by mouth every other day.   furosemide (LASIX) 20 MG tablet TAKE 1 TABLET EVERY DAY   Loratadine-Pseudoephedrine (CLARITIN-D 12 HOUR PO) Take 1 tablet by mouth daily.    metoprolol tartrate (LOPRESSOR) 25 MG tablet TAKE 1 TABLET TWICE DAILY   nitroGLYCERIN (NITROSTAT) 0.4 MG SL tablet Place 1 tablet (0.4 mg total) under the tongue every 5 (five) minutes x 3 doses as needed for chest pain.   pantoprazole (PROTONIX) 20 MG tablet Take 1 tablet (20 mg total) by mouth daily.   potassium chloride SA (KLOR-CON M) 20 MEQ tablet TAKE 1 TABLET EVERY DAY  VITAMIN D PO Take 2,000 mg by mouth daily.     Allergies:   Brilinta [ticagrelor], Lisinopril, Mushroom extract complex, Other, and Tetracyclines & related   Social History   Socioeconomic History   Marital status: Married    Spouse name: Not on file   Number of children: Not on file   Years of education: Not on file   Highest education level: Not on file  Occupational History   Not on file  Tobacco Use   Smoking status: Former    Packs/day: 1.00    Types: Cigarettes   Smokeless tobacco: Never   Tobacco comments:    per patient-quit 02/2016  Substance and Sexual Activity   Alcohol use: No    Alcohol/week: 0.0 standard drinks of alcohol   Drug use:  No   Sexual activity: Not on file  Other Topics Concern   Not on file  Social History Narrative   Not on file   Social Determinants of Health   Financial Resource Strain: Not on file  Food Insecurity: Not on file  Transportation Needs: Not on file  Physical Activity: Not on file  Stress: Not on file  Social Connections: Not on file     Family History: The patient's family history includes Diabetes in an other family member; Hypertension in an other family member.  ROS:   Please see the history of present illness.    All other systems reviewed and are negative.  EKGs/Labs/Other Studies Reviewed:     EKG:  EKG is ordered today.  The ekg ordered today demonstrates NSR 72 bpm, incomplete RBBB, LAFB  Recent Labs: No results found for requested labs within last 365 days.  Recent Lipid Panel    Component Value Date/Time   CHOL 132 08/21/2016 1024   TRIG 174.0 (H) 08/21/2016 1024   HDL 38.00 (L) 08/21/2016 1024   CHOLHDL 3 08/21/2016 1024   VLDL 34.8 08/21/2016 1024   LDLCALC 59 08/21/2016 1024   LDLDIRECT 138.4 07/16/2012 1345     Risk Assessment/Calculations:                Physical Exam:    VS:  BP 128/78   Pulse 72   Ht 5\' 6"  (1.676 m)   Wt 144 lb (65.3 kg)   SpO2 97%   BMI 23.24 kg/m     Wt Readings from Last 3 Encounters:  11/10/22 144 lb (65.3 kg)  11/12/21 152 lb 3.2 oz (69 kg)  11/02/20 150 lb 6.4 oz (68.2 kg)     GEN:  Well nourished, well developed in no acute distress HEENT: Normal NECK: No JVD; No carotid bruits LYMPHATICS: No lymphadenopathy CARDIAC: RRR, no murmurs, rubs, gallops RESPIRATORY:  Clear to auscultation without rales, wheezing or rhonchi  ABDOMEN: Soft, non-tender, non-distended MUSCULOSKELETAL:  No edema; No deformity  SKIN: Warm and dry NEUROLOGIC:  Alert and oriented x 3 PSYCHIATRIC:  Normal affect   ASSESSMENT:    1. Coronary artery disease involving native coronary artery of native heart without angina pectoris    2. Right leg pain   3. Mixed hyperlipidemia   4. Essential hypertension   5. Tobacco abuse    PLAN:    In order of problems listed above:  No chest discomfort.  Continue aspirin for antiplatelet therapy and high intensity statin drug.  Also treated with a beta-blocker.  Follow-up 1 year. Seems atypical for claudication.  Pulses are difficult to palpate.  Recommend ABIs. Blood pressure is well-controlled on metoprolol alone.  No changes made today. Cholesterol levels have been good in the past.  She is due for repeat labs.  Will order fasting lipids and LFTs.  Last LDL cholesterol was 59 mg/dL. Cessation counseling done.  Smoking less and less.  States she can go days now without smoking.  Her husband is a heavy smoker.           Medication Adjustments/Labs and Tests Ordered: Current medicines are reviewed at length with the patient today.  Concerns regarding medicines are outlined above.  Orders Placed This Encounter  Procedures   CBC   Comprehensive metabolic panel   Lipid panel   EKG 12-Lead   VAS [place] ABI WITH/WO TBI   No orders of the defined types were placed in this encounter.   Patient Instructions  Medication Instructions:  Your physician recommends that you continue on your current medications as directed. Please refer to the Current Medication list given to you today.  *If you need a refill on your cardiac medications before your next appointment, please call your pharmacy*   Lab Work: CBC, CMET, Lipids (this week) If you have labs (blood work) drawn today and your tests are completely normal, you will receive your results only by: MyChart Message (if you have MyChart) OR A paper copy in the mail If you have any lab test that is abnormal or we need to change your treatment, we will call you to review the results.   Testing/Procedures: Ankle Brachial Index Your physician has requested that you have an ankle brachial index (ABI). During this test an  ultrasound and blood pressure cuff are used to evaluate the arteries that supply the arms and legs with blood. Allow thirty minutes for this exam. There are no restrictions or special instructions.  Follow-Up: At [facility], you and your health needs are our priority.  As part of our continuing mission to provide you with exceptional heart care, we have created designated Provider Care Teams.  These Care Teams include your primary Cardiologist (physician) and Advanced Practice Providers (APPs -  Physician Assistants and Nurse Practitioners) who all work together to provide you with the care you need, when you need it.  We recommend signing up for the patient portal called "MyChart".  Sign up information is provided on this After Visit Summary.  MyChart is used to connect with patients for Virtual Visits (Telemedicine).  Patients are able to view lab/test results, encounter notes, upcoming appointments, etc.  Non-urgent messages can be sent to your provider as well.   To learn more about what you can do with MyChart, go to [email or web address].    Your next appointment:   1 year(s)  Provider:   Tonny Bollman, MD        Signed, Tonny Bollman, MD  11/10/2022 3:04 PM    [facility]

## 2022-11-13 ENCOUNTER — Other Ambulatory Visit: Payer: Self-pay | Admitting: Cardiovascular Disease

## 2022-11-13 DIAGNOSIS — I2510 Atherosclerotic heart disease of native coronary artery without angina pectoris: Secondary | ICD-10-CM

## 2022-11-13 DIAGNOSIS — M79604 Pain in right leg: Secondary | ICD-10-CM

## 2022-11-13 DIAGNOSIS — E782 Mixed hyperlipidemia: Secondary | ICD-10-CM

## 2022-11-14 ENCOUNTER — Ambulatory Visit (HOSPITAL_COMMUNITY)
Admission: RE | Admit: 2022-11-14 | Discharge: 2022-11-14 | Disposition: A | Discharge status: Home / Self Care | Payer: Medicare HMO | Source: Ambulatory Visit | Attending: Cardiovascular Disease | Admitting: Cardiovascular Disease

## 2022-11-14 DIAGNOSIS — E782 Mixed hyperlipidemia: Secondary | ICD-10-CM | POA: Insufficient documentation

## 2022-11-14 DIAGNOSIS — M79604 Pain in right leg: Secondary | ICD-10-CM | POA: Diagnosis not present

## 2022-11-14 DIAGNOSIS — I2510 Atherosclerotic heart disease of native coronary artery without angina pectoris: Secondary | ICD-10-CM | POA: Insufficient documentation

## 2022-11-14 LAB — VAS US ABI WITH/WO TBI
Left ABI: 1.06
Right ABI: 1.1

## 2022-11-17 ENCOUNTER — Ambulatory Visit: Payer: Medicare HMO | Attending: Cardiovascular Disease

## 2022-11-17 DIAGNOSIS — M79604 Pain in right leg: Secondary | ICD-10-CM | POA: Diagnosis not present

## 2022-11-17 DIAGNOSIS — I2510 Atherosclerotic heart disease of native coronary artery without angina pectoris: Secondary | ICD-10-CM | POA: Diagnosis not present

## 2022-11-17 DIAGNOSIS — E782 Mixed hyperlipidemia: Secondary | ICD-10-CM | POA: Diagnosis not present

## 2022-11-18 LAB — COMPREHENSIVE METABOLIC PANEL
ALT: 17 IU/L (ref 0–32)
AST: 20 IU/L (ref 0–40)
Albumin/Globulin Ratio: 1.5 (ref 1.2–2.2)
Albumin: 4.4 g/dL (ref 3.8–4.8)
Alkaline Phosphatase: 107 IU/L (ref 44–121)
BUN/Creatinine Ratio: 18 (ref 12–28)
BUN: 16 mg/dL (ref 8–27)
Bilirubin Total: 0.5 mg/dL (ref 0.0–1.2)
CO2: 25 mmol/L (ref 20–29)
Calcium: 10.5 mg/dL — ABNORMAL HIGH (ref 8.7–10.3)
Chloride: 98 mmol/L (ref 96–106)
Creatinine, Ser: 0.87 mg/dL (ref 0.57–1.00)
Globulin, Total: 2.9 g/dL (ref 1.5–4.5)
Glucose: 109 mg/dL — ABNORMAL HIGH (ref 70–99)
Potassium: 4.5 mmol/L (ref 3.5–5.2)
Sodium: 138 mmol/L (ref 134–144)
Total Protein: 7.3 g/dL (ref 6.0–8.5)
eGFR: 69 mL/min/{1.73_m2} (ref >59)

## 2022-11-18 LAB — LIPID PANEL
Chol/HDL Ratio: 2.7 ratio (ref 0.0–4.4)
Cholesterol, Total: 142 mg/dL (ref 100–199)
HDL: 52 mg/dL (ref >39)
LDL Chol Calc (NIH): 68 mg/dL (ref 0–99)
Triglycerides: 122 mg/dL (ref 0–149)
VLDL Cholesterol Cal: 22 mg/dL (ref 5–40)

## 2022-11-18 LAB — CBC
Hematocrit: 42.9 % (ref 34.0–46.6)
Hemoglobin: 14.3 g/dL (ref 11.1–15.9)
MCH: 30.1 pg (ref 26.6–33.0)
MCHC: 33.3 g/dL (ref 31.5–35.7)
MCV: 90 fL (ref 79–97)
Platelets: 403 10*3/uL (ref 150–450)
RBC: 4.75 x10E6/uL (ref 3.77–5.28)
RDW: 13.1 % (ref 11.7–15.4)
WBC: 9.8 10*3/uL (ref 3.4–10.8)

## 2022-12-03 DIAGNOSIS — Z720 Tobacco use: Secondary | ICD-10-CM | POA: Diagnosis not present

## 2022-12-03 DIAGNOSIS — I2510 Atherosclerotic heart disease of native coronary artery without angina pectoris: Secondary | ICD-10-CM | POA: Diagnosis not present

## 2022-12-03 DIAGNOSIS — E785 Hyperlipidemia, unspecified: Secondary | ICD-10-CM | POA: Diagnosis not present

## 2022-12-03 DIAGNOSIS — I10 Essential (primary) hypertension: Secondary | ICD-10-CM | POA: Diagnosis not present

## 2022-12-03 DIAGNOSIS — Z1331 Encounter for screening for depression: Secondary | ICD-10-CM | POA: Diagnosis not present

## 2022-12-03 DIAGNOSIS — M810 Age-related osteoporosis without current pathological fracture: Secondary | ICD-10-CM | POA: Diagnosis not present

## 2022-12-03 DIAGNOSIS — E559 Vitamin D deficiency, unspecified: Secondary | ICD-10-CM | POA: Diagnosis not present

## 2022-12-03 DIAGNOSIS — K219 Gastro-esophageal reflux disease without esophagitis: Secondary | ICD-10-CM | POA: Diagnosis not present

## 2022-12-03 DIAGNOSIS — Z79899 Other long term (current) drug therapy: Secondary | ICD-10-CM | POA: Diagnosis not present

## 2022-12-03 DIAGNOSIS — Z0000 Encounter for general adult medical examination without abnormal findings: Secondary | ICD-10-CM | POA: Diagnosis not present

## 2022-12-30 ENCOUNTER — Ambulatory Visit
Admission: RE | Admit: 2022-12-30 | Discharge: 2022-12-30 | Disposition: A | Discharge status: Home / Self Care | Payer: Medicare HMO | Source: Ambulatory Visit | Attending: Internal Medicine | Admitting: Internal Medicine

## 2022-12-30 ENCOUNTER — Other Ambulatory Visit: Payer: Self-pay | Admitting: Internal Medicine

## 2022-12-30 DIAGNOSIS — M5441 Lumbago with sciatica, right side: Secondary | ICD-10-CM

## 2022-12-30 DIAGNOSIS — M545 Low back pain, unspecified: Secondary | ICD-10-CM | POA: Diagnosis not present

## 2023-01-05 ENCOUNTER — Other Ambulatory Visit: Payer: Self-pay | Admitting: *Deleted

## 2023-01-05 DIAGNOSIS — I2510 Atherosclerotic heart disease of native coronary artery without angina pectoris: Secondary | ICD-10-CM

## 2023-01-05 DIAGNOSIS — R0602 Shortness of breath: Secondary | ICD-10-CM

## 2023-01-05 DIAGNOSIS — I10 Essential (primary) hypertension: Secondary | ICD-10-CM

## 2023-01-05 DIAGNOSIS — I739 Peripheral vascular disease, unspecified: Secondary | ICD-10-CM

## 2023-01-05 MED ORDER — POTASSIUM CHLORIDE CRYS ER 20 MEQ PO TBCR: 20.0000 meq | EXTENDED_RELEASE_TABLET | Freq: Every day | ORAL | 3 refills | Status: DC

## 2023-01-05 MED ORDER — ATORVASTATIN CALCIUM 40 MG PO TABS: 40.0000 mg | ORAL_TABLET | ORAL | 3 refills | Status: DC

## 2023-01-05 MED ORDER — FUROSEMIDE 20 MG PO TABS: 20.0000 mg | ORAL_TABLET | Freq: Every day | ORAL | 3 refills | Status: DC

## 2023-01-05 MED ORDER — METOPROLOL TARTRATE 25 MG PO TABS: 25.0000 mg | ORAL_TABLET | Freq: Two times a day (BID) | ORAL | 3 refills | Status: DC

## 2023-01-14 DIAGNOSIS — M25551 Pain in right hip: Secondary | ICD-10-CM | POA: Diagnosis not present

## 2023-01-14 DIAGNOSIS — R262 Difficulty in walking, not elsewhere classified: Secondary | ICD-10-CM | POA: Diagnosis not present

## 2023-01-14 DIAGNOSIS — M79604 Pain in right leg: Secondary | ICD-10-CM | POA: Diagnosis not present

## 2023-01-21 DIAGNOSIS — M79604 Pain in right leg: Secondary | ICD-10-CM | POA: Diagnosis not present

## 2023-01-21 DIAGNOSIS — M25551 Pain in right hip: Secondary | ICD-10-CM | POA: Diagnosis not present

## 2023-01-21 DIAGNOSIS — R262 Difficulty in walking, not elsewhere classified: Secondary | ICD-10-CM | POA: Diagnosis not present

## 2023-01-27 DIAGNOSIS — M25551 Pain in right hip: Secondary | ICD-10-CM | POA: Diagnosis not present

## 2023-01-27 DIAGNOSIS — M79604 Pain in right leg: Secondary | ICD-10-CM | POA: Diagnosis not present

## 2023-01-27 DIAGNOSIS — R262 Difficulty in walking, not elsewhere classified: Secondary | ICD-10-CM | POA: Diagnosis not present

## 2023-01-29 DIAGNOSIS — M79604 Pain in right leg: Secondary | ICD-10-CM | POA: Diagnosis not present

## 2023-01-29 DIAGNOSIS — R262 Difficulty in walking, not elsewhere classified: Secondary | ICD-10-CM | POA: Diagnosis not present

## 2023-01-29 DIAGNOSIS — M25551 Pain in right hip: Secondary | ICD-10-CM | POA: Diagnosis not present

## 2023-02-03 DIAGNOSIS — R262 Difficulty in walking, not elsewhere classified: Secondary | ICD-10-CM | POA: Diagnosis not present

## 2023-02-03 DIAGNOSIS — M25551 Pain in right hip: Secondary | ICD-10-CM | POA: Diagnosis not present

## 2023-02-03 DIAGNOSIS — M79604 Pain in right leg: Secondary | ICD-10-CM | POA: Diagnosis not present

## 2023-02-04 ENCOUNTER — Encounter: Payer: Self-pay | Admitting: Cardiovascular Disease

## 2023-02-04 DIAGNOSIS — E782 Mixed hyperlipidemia: Secondary | ICD-10-CM

## 2023-02-04 NOTE — Telephone Encounter (Signed)
Pt requesting alternative to statin medication. Complains of leg pain and feels strongly that she would like an alternative to statins as a whole. Currently on Atorvastatin 40mg  every other day with good LDL response (labs from January this year show LDL of 68). Spoke with patient who is willing to see pharmD clinic here to discuss alternatives. Order placed for lipid clinic visit at this time.

## 2023-02-04 NOTE — Telephone Encounter (Signed)
Thank you. agree 

## 2023-02-05 DIAGNOSIS — R262 Difficulty in walking, not elsewhere classified: Secondary | ICD-10-CM | POA: Diagnosis not present

## 2023-02-05 DIAGNOSIS — M79604 Pain in right leg: Secondary | ICD-10-CM | POA: Diagnosis not present

## 2023-02-05 DIAGNOSIS — M25551 Pain in right hip: Secondary | ICD-10-CM | POA: Diagnosis not present

## 2023-02-10 DIAGNOSIS — M25551 Pain in right hip: Secondary | ICD-10-CM | POA: Diagnosis not present

## 2023-02-10 DIAGNOSIS — M79604 Pain in right leg: Secondary | ICD-10-CM | POA: Diagnosis not present

## 2023-02-10 DIAGNOSIS — R262 Difficulty in walking, not elsewhere classified: Secondary | ICD-10-CM | POA: Diagnosis not present

## 2023-02-12 DIAGNOSIS — M79604 Pain in right leg: Secondary | ICD-10-CM | POA: Diagnosis not present

## 2023-02-12 DIAGNOSIS — M25551 Pain in right hip: Secondary | ICD-10-CM | POA: Diagnosis not present

## 2023-02-12 DIAGNOSIS — R262 Difficulty in walking, not elsewhere classified: Secondary | ICD-10-CM | POA: Diagnosis not present

## 2023-02-17 DIAGNOSIS — M25551 Pain in right hip: Secondary | ICD-10-CM | POA: Diagnosis not present

## 2023-02-17 DIAGNOSIS — M79604 Pain in right leg: Secondary | ICD-10-CM | POA: Diagnosis not present

## 2023-02-17 DIAGNOSIS — R262 Difficulty in walking, not elsewhere classified: Secondary | ICD-10-CM | POA: Diagnosis not present

## 2023-02-19 DIAGNOSIS — R262 Difficulty in walking, not elsewhere classified: Secondary | ICD-10-CM | POA: Diagnosis not present

## 2023-02-19 DIAGNOSIS — M25551 Pain in right hip: Secondary | ICD-10-CM | POA: Diagnosis not present

## 2023-02-19 DIAGNOSIS — M79604 Pain in right leg: Secondary | ICD-10-CM | POA: Diagnosis not present

## 2023-02-20 ENCOUNTER — Ambulatory Visit: Payer: Medicare HMO | Attending: Cardiology | Admitting: Pharmacist

## 2023-02-20 DIAGNOSIS — E782 Mixed hyperlipidemia: Secondary | ICD-10-CM

## 2023-02-20 DIAGNOSIS — E785 Hyperlipidemia, unspecified: Secondary | ICD-10-CM | POA: Diagnosis not present

## 2023-02-20 NOTE — Progress Notes (Signed)
Patient ID: Melissa Nixon                 DOB: 1946-06-20                    MRN: [id number]      HPI: Melissa Nixon is a 77 y.o. female patient referred to lipid clinic by Dr. Excell Seltzer. PMH is significant for MI s/p DES to the proximal LAD, HLD, HTN, tobacco abuse. Patient's LDL-C on atorvastatin was 68. However, patient attributes her muscle weakness to her atorvastatin and has since stopped the medication.  Patient presents today accompanied by her husband. She tells [place] about how her legs have become progressively weak. Mainly her right leg. Now also have sciatica pain. Has never been that active. Her PT told her that she needed to get off the statins.   Reviewed options for lowering LDL cholesterol, including ezetimibe, PCSK-9 inhibitors, bempedoic acid and inclisiran.  Discussed mechanisms of action, dosing, side effects and potential decreases in LDL cholesterol.  Also reviewed cost information and potential options for patient assistance.   Current Medications:  Intolerances:  Risk Factors:  LDL-C goal:  ApoB goal:   Diet:   Exercise:   Family History: The patient's family history includes Diabetes in an other family member; Hypertension in an other family member.   Social History:   Labs: Lipid Panel     Component Value Date/Time   CHOL 142 11/17/2022 1158   TRIG 122 11/17/2022 1158   HDL 52 11/17/2022 1158   CHOLHDL 2.7 11/17/2022 1158   CHOLHDL 3 08/21/2016 1024   VLDL 34.8 08/21/2016 1024   LDLCALC 68 11/17/2022 1158   LDLDIRECT 138.4 07/16/2012 1345   LABVLDL 22 11/17/2022 1158    Past Medical History:  Diagnosis Date   Allergic rhinitis due to other allergen    CAD, multiple vessel, residual disease LCX 40% OM 01/22/2014   Cholecystitis    ELECTROCARDIOGRAM, ABNORMAL    GERD    Hyperglycemia 01/22/2014   Hyperlipidemia LDL goal < 70 01/22/2014   HYPERTENSION    LOW BACK PAIN 01/02/2010   Qualifier: Diagnosis of  By: Yetta Barre MD, Bernadene Bell.    LV  dysfunction, EF at cath 45-50% 01/22/2014   Murmur, hx VSD, evaluated by cardiology in 2013 06/03/2012   Osteoporosis, dexa 2014, fosamax, cal, vit D and exercise advised 06/01/2013   RBBB, chronic 01/22/2014   STEMI (ST elevation myocardial infarction), 01/20/14, LAD PCI/Stent DES Promus preimer 01/20/2014   Tobacco use, stopped in 2013 01/22/2014    Current Outpatient Medications on File Prior to Visit  Medication Sig Dispense Refill   alendronate (FOSAMAX) 10 MG tablet Take 10 mg by mouth daily before breakfast. Take with a full glass of water on an empty stomach.     aspirin 81 MG tablet Take 81 mg by mouth daily.     atorvastatin (LIPITOR) 40 MG tablet Take 1 tablet (40 mg total) by mouth every other day. 45 tablet 3   Calcium Carbonate-Vit D-Min (CALCIUM 1200 PO) Take by mouth daily at 6 (six) AM.     Cyanocobalamin (VITAMIN B12) 1000 MCG TBCR Take 1 tablet by mouth every other day.     furosemide (LASIX) 20 MG tablet Take 1 tablet (20 mg total) by mouth daily. 90 tablet 3   Loratadine-Pseudoephedrine (CLARITIN-D 12 HOUR PO) Take 1 tablet by mouth daily.      metoprolol tartrate (LOPRESSOR) 25 MG tablet Take 1 tablet (25 mg  total) by mouth 2 (two) times daily. 180 tablet 3   nitroGLYCERIN (NITROSTAT) 0.4 MG SL tablet Place 1 tablet (0.4 mg total) under the tongue every 5 (five) minutes x 3 doses as needed for chest pain. 25 tablet 3   pantoprazole (PROTONIX) 20 MG tablet Take 1 tablet (20 mg total) by mouth daily. 90 tablet 3   potassium chloride SA (KLOR-CON M) 20 MEQ tablet Take 1 tablet (20 mEq total) by mouth daily. 90 tablet 3   VITAMIN D PO Take 2,000 mg by mouth daily.     No current facility-administered medications on file prior to visit.    Allergies  Allergen Reactions   Brilinta [Ticagrelor] Shortness Of Breath   Lisinopril Other (See Comments)    Lethargic    Mushroom Extract Complex Other (See Comments)    Heart race   Other Other (See Comments)    Olives:heart race    Tetracyclines & Related Other (See Comments)    Yeast infections    Assessment/Plan:  1. Hyperlipidemia -  No problem-specific Assessment & Plan notes found for this encounter.    Thank you,  Olene Floss, Pharm.D, BCPS, CPP [facility] A Division of  [facility] [address], [place], [place] [address]  Phone: [phone]; Fax: [phone]

## 2023-02-23 NOTE — Assessment & Plan Note (Signed)
Assessment: Patient thinks her leg weakness and sciatic pain from her statin.  Explained that leg pain and weakness from statins is usually bilateral and her pain is mainly only on the right side. Patient has stopped statin and has seen no improvement Has done physical therapy for 3 weeks and has not noticed much improvement other than her legs being very sore afterwards.  Explained to patient that this can take time to regain strength and soreness is normal. Patient is taking Krill oil, discusses lack of benefit and potential harms as being a supplement that is not well-regulated and could be contaminated We discussed alternatives to statins including ezetimibe, PCSK9 bempedoic acid and a different statin Patient would like to continue on Krill oil and check LDL cholesterol in a few months.  If LDL-C is still elevated patient will consider resuming statin or statin alternative  Plan: Recheck lipids 6/28

## 2023-02-24 DIAGNOSIS — M25551 Pain in right hip: Secondary | ICD-10-CM | POA: Diagnosis not present

## 2023-02-24 DIAGNOSIS — R262 Difficulty in walking, not elsewhere classified: Secondary | ICD-10-CM | POA: Diagnosis not present

## 2023-02-24 DIAGNOSIS — M79604 Pain in right leg: Secondary | ICD-10-CM | POA: Diagnosis not present

## 2023-03-06 DIAGNOSIS — M5416 Radiculopathy, lumbar region: Secondary | ICD-10-CM | POA: Diagnosis not present

## 2023-03-11 ENCOUNTER — Other Ambulatory Visit: Payer: Self-pay | Admitting: Orthopedic Surgery

## 2023-03-11 DIAGNOSIS — M545 Low back pain, unspecified: Secondary | ICD-10-CM

## 2023-04-05 ENCOUNTER — Ambulatory Visit
Admission: RE | Admit: 2023-04-05 | Discharge: 2023-04-05 | Disposition: A | Discharge status: Home / Self Care | Payer: Medicare HMO | Source: Ambulatory Visit | Attending: Orthopedic Surgery | Admitting: Orthopedic Surgery

## 2023-04-05 DIAGNOSIS — M545 Low back pain, unspecified: Secondary | ICD-10-CM

## 2023-04-05 DIAGNOSIS — M47816 Spondylosis without myelopathy or radiculopathy, lumbar region: Secondary | ICD-10-CM | POA: Diagnosis not present

## 2023-04-05 DIAGNOSIS — M5126 Other intervertebral disc displacement, lumbar region: Secondary | ICD-10-CM | POA: Diagnosis not present

## 2023-04-05 DIAGNOSIS — M48061 Spinal stenosis, lumbar region without neurogenic claudication: Secondary | ICD-10-CM | POA: Diagnosis not present

## 2023-04-07 DIAGNOSIS — M5416 Radiculopathy, lumbar region: Secondary | ICD-10-CM | POA: Diagnosis not present

## 2023-04-13 ENCOUNTER — Other Ambulatory Visit: Payer: Self-pay | Admitting: Orthopedic Surgery

## 2023-04-23 ENCOUNTER — Telehealth: Payer: Self-pay | Admitting: *Deleted

## 2023-04-23 NOTE — Telephone Encounter (Signed)
   Name: Melissa Nixon  DOB: [birth year]  MRN: [id number]  Primary Cardiologist: Tonny Bollman, MD   Preoperative team, please contact this patient and set up a phone call appointment for further preoperative risk assessment. Please obtain consent and complete medication review. Thank you for your help.  I confirm that guidance regarding antiplatelet and oral anticoagulation therapy has been completed and, if necessary, noted below.  Regarding ASA therapy, we recommend continuation of ASA throughout the perioperative period.  However, if the surgeon feels that cessation of ASA is required in the perioperative period, it may be stopped 5-7 days prior to surgery with a plan to resume it as soon as felt to be feasible from a surgical standpoint in the post-operative period.    Napoleon Form, Leodis Rains, NP 04/23/2023, 3:52 PM [facility]

## 2023-04-23 NOTE — Telephone Encounter (Signed)
I s/w the pt about pre op tele appt. Pt tells me that she has made some changes with her cholesterol medication, that she stopped the Atorvastatin and started krill oil. Pt states she has been doing PT as well and now over some time she is beginning to feel better and not having the pain she was having. Pt states she really thinks she might hold off off on this procedure. She will d/w Dr. Yevette Edwards and call us back to let us know if she is going to proceed or not.

## 2023-04-23 NOTE — Telephone Encounter (Signed)
   Pre-operative Risk Assessment    Patient Name: Melissa Nixon  DOB: 05-22-1946 MRN: [id number]      Request for Surgical Clearance    Procedure:   RIGHT SIDED L3-4 TRANSFORAMINAL LUMBAR INTERBODY FUSION WITH INSTRUMENTATION AND ALLOGRAFT  Date of Surgery:  Clearance 05/13/23                                 Surgeon:  DR. MARK DUMONSKI Surgeon's Group or Practice Name:  [facility] Phone number:  [phone] ATTN: Tobey Bride Fax number:  [phone]   Type of Clearance Requested:   - Medical ; ASA    Type of Anesthesia:  Not Indicated (GENERAL ?)   Additional requests/questions:    Elpidio Anis   04/23/2023, 3:46 PM

## 2023-04-24 ENCOUNTER — Ambulatory Visit: Payer: Medicare HMO | Attending: Cardiovascular Disease

## 2023-04-24 DIAGNOSIS — E782 Mixed hyperlipidemia: Secondary | ICD-10-CM | POA: Diagnosis not present

## 2023-04-25 LAB — LIPID PANEL
Chol/HDL Ratio: 4 ratio (ref 0.0–4.4)
Cholesterol, Total: 202 mg/dL — ABNORMAL HIGH (ref 100–199)
HDL: 51 mg/dL (ref >39)
LDL Chol Calc (NIH): 124 mg/dL — ABNORMAL HIGH (ref 0–99)
Triglycerides: 150 mg/dL — ABNORMAL HIGH (ref 0–149)
VLDL Cholesterol Cal: 27 mg/dL (ref 5–40)

## 2023-04-27 ENCOUNTER — Telehealth: Payer: Self-pay | Admitting: Pharmacist

## 2023-04-27 NOTE — Telephone Encounter (Signed)
Patient off atorvastatin due to leg weakness. LDL above goal of <70. Patient previously seen in PharmD clinic. She wanted to try Krill oil and repeat labs. Called pt to discuss labs and medication options. States she is no longer in pain. Does not want to take statins and would prefer pill. Reviewed the 2 pill options. She would like to come in the office and discuss. Scheduled for 7/22 @ 3:30.

## 2023-05-06 ENCOUNTER — Encounter (HOSPITAL_COMMUNITY): Admission: RE | Admit: 2023-05-06 | Payer: Medicare HMO | Source: Ambulatory Visit

## 2023-05-13 ENCOUNTER — Ambulatory Visit: Admit: 2023-05-13 | Payer: Medicare HMO | Admitting: Orthopedic Surgery

## 2023-05-13 SURGERY — TRANSFORAMINAL LUMBAR INTERBODY FUSION (TLIF) WITH PEDICLE SCREW FIXATION 1 LEVEL
Anesthesia: General | Laterality: Right

## 2023-05-18 ENCOUNTER — Ambulatory Visit: Payer: Medicare HMO | Attending: Cardiovascular Disease | Admitting: Pharmacist

## 2023-05-18 DIAGNOSIS — E782 Mixed hyperlipidemia: Secondary | ICD-10-CM | POA: Diagnosis not present

## 2023-05-18 MED ORDER — ROSUVASTATIN CALCIUM 5 MG PO TABS: 5.0000 mg | ORAL_TABLET | Freq: Every day | ORAL | 3 refills | Status: DC

## 2023-05-18 NOTE — Patient Instructions (Addendum)
Your LDL cholesterol is 124 and your goal is < 70  Start Crestor (rosuvastatin) 5mg  - 1 tablet daily  Call Aundra Millet, PharmD with any tolerability issues #[phone] and we can try Nexlizet. It's effective and well tolerated but does cost ~$45/month

## 2023-05-18 NOTE — Progress Notes (Signed)
Patient ID: Melissa Nixon                 DOB: 14-Oct-1946                    MRN: [id number]     HPI: Melissa Nixon is a 77 y.o. female patient referred to lipid clinic by Dr Melissa Nixon. PMH is significant for MI s/p DES to the proximal LAD, HLD, HTN, tobacco abuse. Patient's LDL-C on atorvastatin was 68. However, she attributed her muscle weakness to her atorvastatin and stopped the medication. She saw Melissa Nixon, PharmD on 02/20/23. Had been off statin for a week at that time and had not noticed improvement in muscle pain which was primarily on her right side. Other lipid options were discussed however pt preferred to take krill oil and repeat labs. Follow up labs showed an increase in both her TG from 122 to 150, and in LDL from 68 to 124. Patient reported at that time that aches had resolved and did not want to take another statin. She presents today to discuss other options.   Pt previously took atorvastatin for 7-8 years. Had myalgias the last year of therapy. Improved a bit when she reduced frequency to every other day but were still present. Was ambulating with a cane at her last visit due to pain when walking. Saw an ortho doc and was planning to have back surgery for sciatica. After being off her statin longer, she reports she can walk just fine, no longer needs a cane, and no longer has back pain. Did have a bulging disk noted on MRI at ortho. Has an occasional stiff knee, thinks secondary to arthritis. Attributes her improved symptoms to taking krill oil. Husband reports that krill oil lowered his cholesterol while statins did not. Reviewed patient's recent labs where her LDL increased by 50% after stopping statin and starting krill oil. Reviewed that krill oil does not have cardiovascular benefit like statins and other prescription lipid lowering therapies.   Current Medications: krill oil Intolerances: atorvastatin 40mg  daily and every other day - myalgias Risk Factors: CAD s/p MI, HTN,  age, tobacco use  LDL-C goal: < 70   Family History: Diabetes and HTN in an other family member.   Social History: + tobacco use  Labs: 04/24/23: TC 202, TG 150, HDL 51, LDL 124 - krill oil 11/17/22: TC 142, TG 122, HDL 52, LDL 68 - atorvastatin 40mg  every other day   Past Medical History:  Diagnosis Date   Allergic rhinitis due to other allergen    CAD, multiple vessel, residual disease LCX 40% OM 01/22/2014   Cholecystitis    ELECTROCARDIOGRAM, ABNORMAL    GERD    Hyperglycemia 01/22/2014   Hyperlipidemia LDL goal < 70 01/22/2014   HYPERTENSION    LOW BACK PAIN 01/02/2010   Qualifier: Diagnosis of  By: Melissa Barre MD, Melissa Nixon.    LV dysfunction, EF at cath 45-50% 01/22/2014   Murmur, hx VSD, evaluated by cardiology in 2013 06/03/2012   Osteoporosis, dexa 2014, fosamax, cal, vit D and exercise advised 06/01/2013   RBBB, chronic 01/22/2014   STEMI (ST elevation myocardial infarction), 01/20/14, LAD PCI/Stent DES Promus preimer 01/20/2014   Tobacco use, stopped in 2013 01/22/2014    Current Outpatient Medications on File Prior to Visit  Medication Sig Dispense Refill   alendronate (FOSAMAX) 10 MG tablet Take 10 mg by mouth daily before breakfast. Take with a full glass of water on an empty  stomach.     aspirin 81 MG tablet Take 81 mg by mouth daily.     Calcium Carbonate-Vit D-Min (CALCIUM 1200 PO) Take by mouth daily at 6 (six) AM.     Cyanocobalamin (VITAMIN B12) 1000 MCG TBCR Take 1 tablet by mouth every other day.     furosemide (LASIX) 20 MG tablet Take 1 tablet (20 mg total) by mouth daily. 90 tablet 3   Loratadine-Pseudoephedrine (CLARITIN-D 12 HOUR PO) Take 1 tablet by mouth daily.      metoprolol tartrate (LOPRESSOR) 25 MG tablet Take 1 tablet (25 mg total) by mouth 2 (two) times daily. 180 tablet 3   nitroGLYCERIN (NITROSTAT) 0.4 MG SL tablet Place 1 tablet (0.4 mg total) under the tongue every 5 (five) minutes x 3 doses as needed for chest pain. 25 tablet 3   pantoprazole (PROTONIX) 20  MG tablet Take 1 tablet (20 mg total) by mouth daily. 90 tablet 3   potassium chloride SA (KLOR-CON M) 20 MEQ tablet Take 1 tablet (20 mEq total) by mouth daily. 90 tablet 3   VITAMIN D PO Take 2,000 mg by mouth daily.     No current facility-administered medications on file prior to visit.    Allergies  Allergen Reactions   Brilinta [Ticagrelor] Shortness Of Breath   Lisinopril Other (See Comments)    Lethargic    Mushroom Extract Complex Other (See Comments)    Heart race   Other Other (See Comments)    Olives:heart race   Tetracyclines & Related Other (See Comments)    Yeast infections    Assessment/Plan:  1. Hyperlipidemia - LDL has increased from 68 on atorvastatin 40mg  daily to 124 on krill oil. Patient is feeling much better and myalgias have resolved off of atorvastatin. She attributes feeling better to starting krill oil but does not want to take another statin. Does not want to take injectable medication either. Discussed that Nexlizet would effectively treat her cholesterol, however the $45 copay is unaffordable for patient. Ezetimibe would not bring LDL to goal. She is ultimately agreeable to try low dose rosuvastatin 5mg  daily and will call with any tolerability issues.  Melissa Nixon E. Melissa Nixon, PharmD, BCACP, CPP [facility] [address], [place], [place] [address] Phone: [phone]; Fax: [phone] 05/18/2023 4:16 PM

## 2023-06-03 ENCOUNTER — Telehealth: Payer: Self-pay | Admitting: Pharmacy Technician

## 2023-06-03 NOTE — Telephone Encounter (Signed)
Pharmacy Patient Advocate Encounter  Received notification from [facility] that Prior Authorization for Nexlizet 180-10MG  tablets has been APPROVED from 05/18/2023 to 10/27/2023   PA #/Case ID/Reference #: [id number]  Key: [id number]

## 2023-07-06 ENCOUNTER — Telehealth: Payer: Self-pay | Admitting: Pharmacist

## 2023-07-06 DIAGNOSIS — E782 Mixed hyperlipidemia: Secondary | ICD-10-CM

## 2023-07-06 NOTE — Telephone Encounter (Signed)
Patient has never taken this medication.

## 2023-07-06 NOTE — Telephone Encounter (Signed)
Called pt and left message to follow up with rosuvastatin tolerability. If tolerating well, will schedule labs. Previously intolerant to atorvastatin and branded meds are cost prohibitive.

## 2023-07-06 NOTE — Telephone Encounter (Signed)
Pt returned call, she is tolerating rosuvastatin well. Scheduled fasting lipid labs for later this week.

## 2023-07-09 ENCOUNTER — Ambulatory Visit: Payer: Medicare HMO | Attending: Cardiology

## 2023-07-09 DIAGNOSIS — E782 Mixed hyperlipidemia: Secondary | ICD-10-CM | POA: Diagnosis not present

## 2023-07-11 LAB — LIPID PANEL
Chol/HDL Ratio: 2.6 ratio (ref 0.0–4.4)
Cholesterol, Total: 119 mg/dL (ref 100–199)
HDL: 45 mg/dL (ref >39)
LDL Chol Calc (NIH): 46 mg/dL (ref 0–99)
Triglycerides: 166 mg/dL — ABNORMAL HIGH (ref 0–149)
VLDL Cholesterol Cal: 28 mg/dL (ref 5–40)

## 2023-07-11 LAB — HEPATIC FUNCTION PANEL
ALT: 19 IU/L (ref 0–32)
AST: 21 IU/L (ref 0–40)
Albumin: 4.1 g/dL (ref 3.8–4.8)
Alkaline Phosphatase: 75 IU/L (ref 44–121)
Bilirubin Total: 0.3 mg/dL (ref 0.0–1.2)
Bilirubin, Direct: <0.1 mg/dL (ref 0.00–0.40)
Total Protein: 6.8 g/dL (ref 6.0–8.5)

## 2023-10-29 ENCOUNTER — Encounter: Payer: Self-pay | Admitting: Cardiovascular Disease

## 2023-10-29 ENCOUNTER — Ambulatory Visit: Payer: Medicare HMO | Attending: Cardiovascular Disease | Admitting: Cardiovascular Disease

## 2023-10-29 VITALS — BP 132/80 | HR 70 | Ht 66.0 in | Wt 140.8 lb

## 2023-10-29 DIAGNOSIS — I739 Peripheral vascular disease, unspecified: Secondary | ICD-10-CM

## 2023-10-29 DIAGNOSIS — Z720 Tobacco use: Secondary | ICD-10-CM | POA: Diagnosis not present

## 2023-10-29 DIAGNOSIS — I10 Essential (primary) hypertension: Secondary | ICD-10-CM

## 2023-10-29 DIAGNOSIS — I2510 Atherosclerotic heart disease of native coronary artery without angina pectoris: Secondary | ICD-10-CM

## 2023-10-29 DIAGNOSIS — E785 Hyperlipidemia, unspecified: Secondary | ICD-10-CM

## 2023-10-29 MED ORDER — METOPROLOL TARTRATE 25 MG PO TABS: 25.0000 mg | ORAL_TABLET | Freq: Two times a day (BID) | ORAL | 3 refills | Status: DC

## 2023-10-29 MED ORDER — POTASSIUM CHLORIDE CRYS ER 20 MEQ PO TBCR
20.0000 meq | EXTENDED_RELEASE_TABLET | Freq: Every day | ORAL | 3 refills | Status: DC
Start: 2023-10-29 — End: 2024-09-20

## 2023-10-29 MED ORDER — FUROSEMIDE 20 MG PO TABS: 20.0000 mg | ORAL_TABLET | Freq: Every day | ORAL | 3 refills | Status: DC

## 2023-10-29 NOTE — Patient Instructions (Signed)
 Follow-Up: At [facility], you and your health needs are our priority.  As part of our continuing mission to provide you with exceptional heart care, we have created designated Provider Care Teams.  These Care Teams include your primary Cardiologist (physician) and Advanced Practice Providers (APPs -  Physician Assistants and Nurse Practitioners) who all work together to provide you with the care you need, when you need it.  Your next appointment:   1 year(s)  Provider:   Tonny Bollman, MD

## 2023-10-29 NOTE — Progress Notes (Signed)
 Cardiology Office Note:    Date:  10/29/2023   ID:  Melissa Nixon, DOB 07-09-1946, MRN [id number]  PCP:  [facility]   [facility] Providers Cardiologist:  Ozell Fell, MD     Referring MD: [facility]   Chief Complaint  Patient presents with   Coronary Artery Disease    History of Present Illness:    Melissa Nixon is a 78 y.o. female presenting for follow-up of coronary artery disease.  The patient suffered an anterior wall MI in 2015, treated with primary PCI of the proximal LAD using a drug-eluting stent.  Postinfarct LVEF was normal on follow-up imaging.  The patient was last seen 1 year ago and was doing well at the time.  Ongoing risk factors include hypertension and hyperlipidemia, both of which have been well-controlled.  Earlier this year she was switched from atorvastatin  to low-dose rosuvastatin  because of myalgias.  Recent lipids with a cholesterol of 119, HDL 45, LDL 46, triglycerides 166.  She has continued to smoke cigarettes.  The patient is here alone today. They have recently purchased a '57 Chevy that they've started restoring. Today, she denies symptoms of palpitations, chest pain, shortness of breath, orthopnea, PND, lower extremity edema, dizziness, or syncope. She complains of 'bloating' with eating. This is worse certain foods.    Current Medications: Current Meds  Medication Sig   alendronate  (FOSAMAX ) 10 MG tablet Take 10 mg by mouth daily before breakfast. Take with a full glass of water on an empty stomach.   aspirin  81 MG tablet Take 81 mg by mouth daily.   Cyanocobalamin (VITAMIN B12) 1000 MCG TBCR Take 1 tablet by mouth every other day.   KRILL OIL PO Take 4,000 mg by mouth daily.   Loratadine-Pseudoephedrine (CLARITIN-D 12 HOUR PO) Take 1 tablet by mouth daily.    nitroGLYCERIN  (NITROSTAT ) 0.4 MG SL tablet Place 1 tablet (0.4 mg total) under the tongue every 5 (five) minutes x 3 doses as  needed for chest pain.   pantoprazole  (PROTONIX ) 20 MG tablet Take 20 mg by mouth as needed.   rosuvastatin  (CRESTOR ) 5 MG tablet Take 1 tablet (5 mg total) by mouth daily.   VITAMIN D PO Take 2,000 mg by mouth daily.   [DISCONTINUED] furosemide  (LASIX ) 20 MG tablet Take 1 tablet (20 mg total) by mouth daily.   [DISCONTINUED] metoprolol  tartrate (LOPRESSOR ) 25 MG tablet Take 1 tablet (25 mg total) by mouth 2 (two) times daily.   [DISCONTINUED] potassium chloride  SA (KLOR-CON  M) 20 MEQ tablet Take 1 tablet (20 mEq total) by mouth daily.     Allergies:   Brilinta  [ticagrelor ], Atorvastatin , Lisinopril , Mushroom extract complex (do not select), Other, and Tetracyclines & related   ROS:   Please see the history of present illness.    Positive for low back and right leg pain.  All other systems reviewed and are negative.  EKGs/Labs/Other Studies Reviewed:    The following studies were reviewed today: Cardiac Studies & Procedures                  EKG:   EKG Interpretation Date/Time:  Thursday October 29 2023 13:42:19 EST Ventricular Rate:  70 PR Interval:  178 QRS Duration:  116 QT Interval:  430 QTC Calculation: 464 R Axis:   -67  Text Interpretation: Normal sinus rhythm Left anterior fascicular block Left ventricular hypertrophy with QRS widening ( R in aVL , Cornell product ) When compared with  ECG of 22-Jan-2014 07:51, Criteria for Anterolateral infarct are no longer Present Nonspecific T wave abnormality now evident in Inferior leads T wave inversion no longer evident in Anterolateral leads Confirmed by Wonda Sharper [phone]) on 10/29/2023 2:01:58 PM    Recent Labs: 11/17/2022: BUN 16; Creatinine, Ser 0.87; Hemoglobin 14.3; Platelets 403; Potassium 4.5; Sodium 138 07/09/2023: ALT 19  Recent Lipid Panel    Component Value Date/Time   CHOL 119 07/09/2023 1429   TRIG 166 (H) 07/09/2023 1429   HDL 45 07/09/2023 1429   CHOLHDL 2.6 07/09/2023 1429   CHOLHDL 3 08/21/2016 1024    VLDL 34.8 08/21/2016 1024   LDLCALC 46 07/09/2023 1429   LDLDIRECT 138.4 07/16/2012 1345     Risk Assessment/Calculations:                Physical Exam:    VS:  BP 132/80   Pulse 70   Ht 5' 6 (1.676 m)   Wt 140 lb 12.8 oz (63.9 kg)   SpO2 98%   BMI 22.73 kg/m     Wt Readings from Last 3 Encounters:  10/29/23 140 lb 12.8 oz (63.9 kg)  11/10/22 144 lb (65.3 kg)  11/12/21 152 lb 3.2 oz (69 kg)     GEN:  Well nourished, well developed in no acute distress HEENT: Normal NECK: No JVD; No carotid bruits LYMPHATICS: No lymphadenopathy CARDIAC: RRR, no murmurs, rubs, gallops RESPIRATORY:  Clear to auscultation without rales, wheezing or rhonchi  ABDOMEN: Soft, non-tender, non-distended MUSCULOSKELETAL:  No edema; No deformity  SKIN: Warm and dry NEUROLOGIC:  Alert and oriented x 3 PSYCHIATRIC:  Normal affect   Assessment & Plan Coronary artery disease involving native coronary artery of native heart without angina pectoris Stable with no symptoms of angina.  Continue aspirin  for antiplatelet therapy and low-dose rosuvastatin .  Continue metoprolol . Hyperlipidemia with target LDL less than 70 She has had a very good response to low-dose rosuvastatin .  Unable to tolerate atorvastatin  due to myalgias.  LDL cholesterol is now 46. Essential hypertension Blood pressure well-controlled on metoprolol  and furosemide  Tobacco abuse Cessation counseling done.  She understands that all of her other risk factors are now very well-controlled and this is by far her most significant ongoing risk of cardiovascular events. Claudication (HCC) Seems to be more neurogenic.  ABIs last year were normal.      Medication Adjustments/Labs and Tests Ordered: Current medicines are reviewed at length with the patient today.  Concerns regarding medicines are outlined above.  Orders Placed This Encounter  Procedures   EKG 12-Lead   Meds ordered this encounter  Medications   furosemide  (LASIX )  20 MG tablet    Sig: Take 1 tablet (20 mg total) by mouth daily.    Dispense:  90 tablet    Refill:  3   metoprolol  tartrate (LOPRESSOR ) 25 MG tablet    Sig: Take 1 tablet (25 mg total) by mouth 2 (two) times daily.    Dispense:  180 tablet    Refill:  3   potassium chloride  SA (KLOR-CON  M) 20 MEQ tablet    Sig: Take 1 tablet (20 mEq total) by mouth daily.    Dispense:  90 tablet    Refill:  3    Patient Instructions  Follow-Up: At [facility], you and your health needs are our priority.  As part of our continuing mission to provide you with exceptional heart care, we have created designated Provider Care Teams.  These Care Teams include your  primary Cardiologist (physician) and Advanced Practice Providers (APPs -  Physician Assistants and Nurse Practitioners) who all work together to provide you with the care you need, when you need it.  Your next appointment:   1 year(s)  Provider:   Ozell Fell, MD        Signed, Ozell Fell, MD  10/29/2023 4:42 PM    [facility]

## 2023-10-29 NOTE — Assessment & Plan Note (Signed)
 She has had a very good response to low-dose rosuvastatin.  Unable to tolerate atorvastatin due to myalgias.  LDL cholesterol is now 46.

## 2023-10-29 NOTE — Assessment & Plan Note (Signed)
 Blood pressure well-controlled on metoprolol and furosemide

## 2023-11-02 IMAGING — MG MM DIGITAL SCREENING BILAT W/ TOMO AND CAD
8 series · 9 of 24 positions shown · non-contrast
Comparison: Previous exam(s).

CLINICAL DATA: Screening.

EXAM:
DIGITAL SCREENING BILATERAL MAMMOGRAM WITH TOMOSYNTHESIS AND CAD
TECHNIQUE: Bilateral screening digital craniocaudal and mediolateral oblique
mammograms were obtained. Bilateral screening digital breast
tomosynthesis was performed. The images were evaluated with
computer-aided detection.

[R MLO synth-2D]
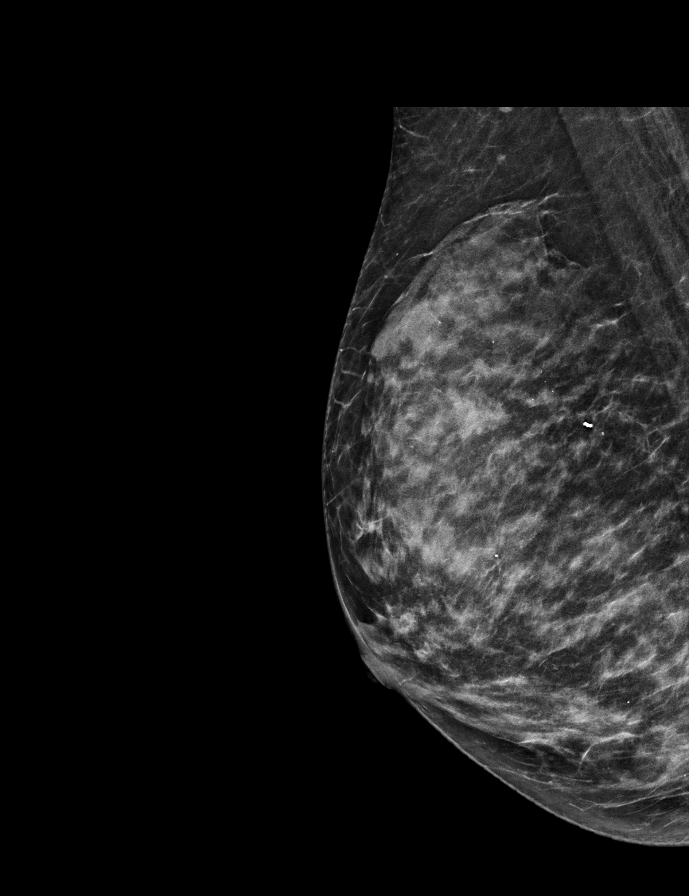

[L CC synth-2D]
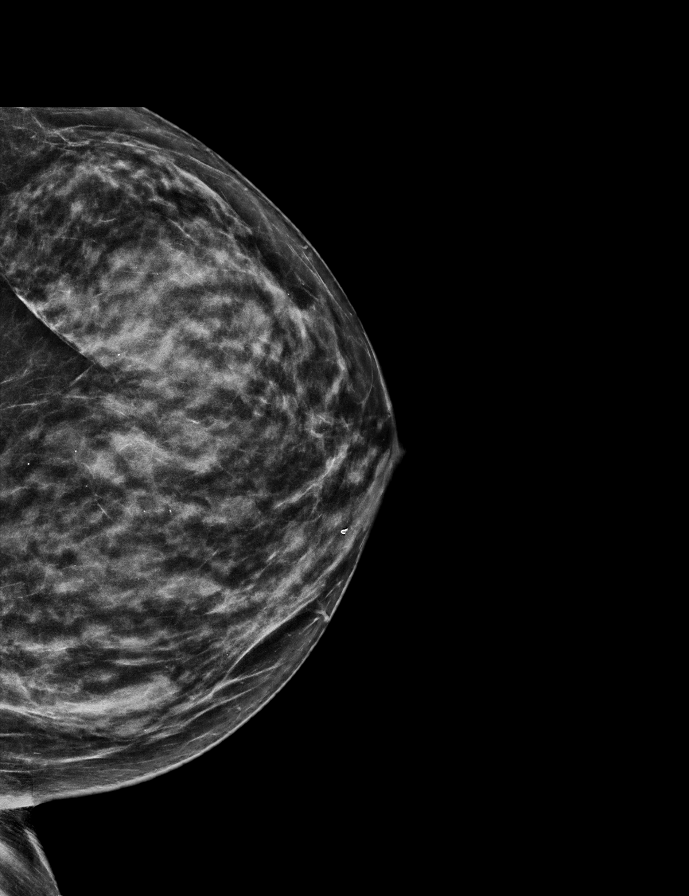

[R CC synth-2D]
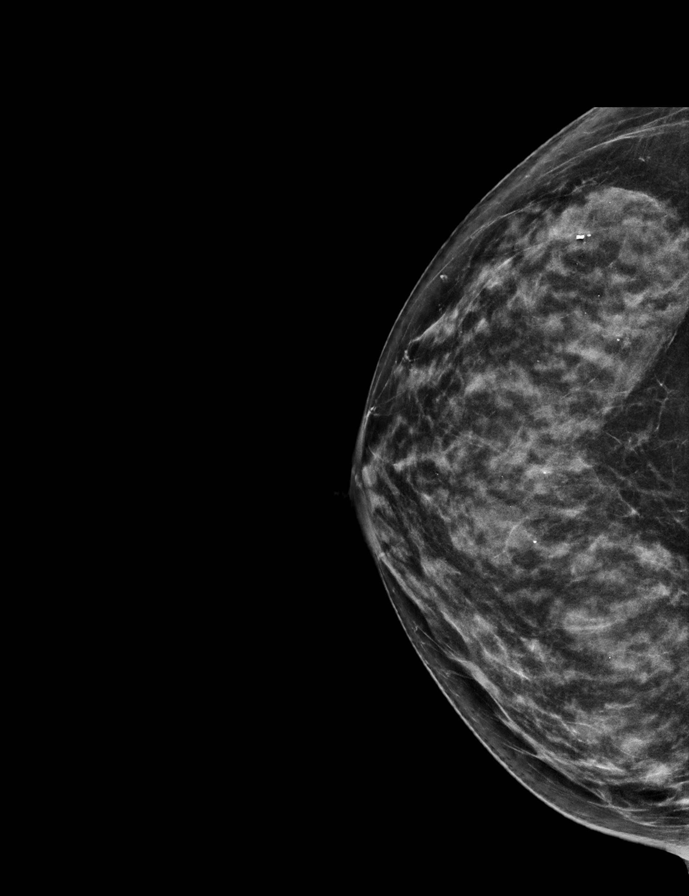

[L MLO synth-2D]
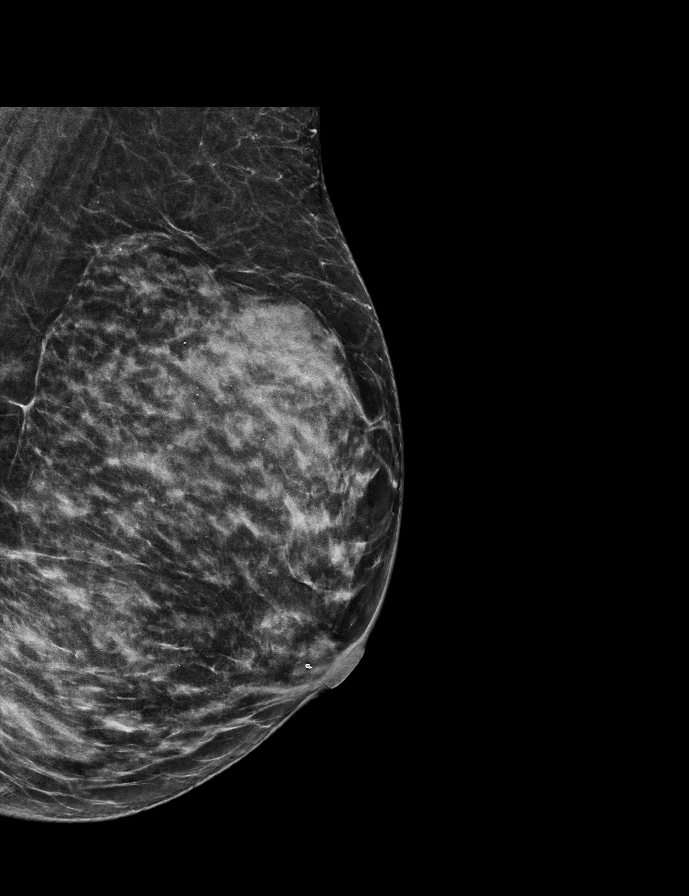

[L MLO tomo · 2 of 55 frames shown]
[frame 18/55]
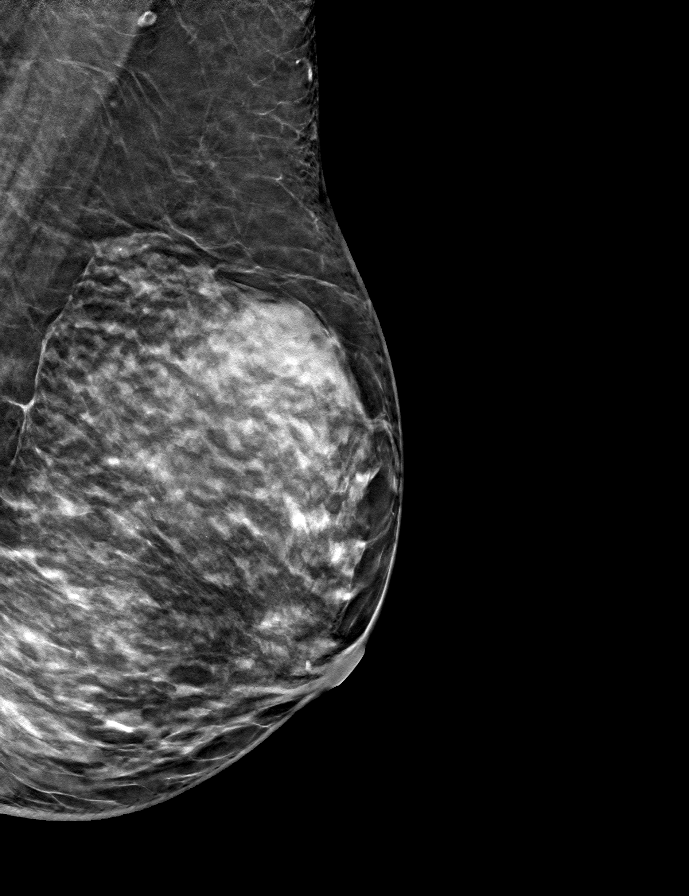
[frame 28/55]
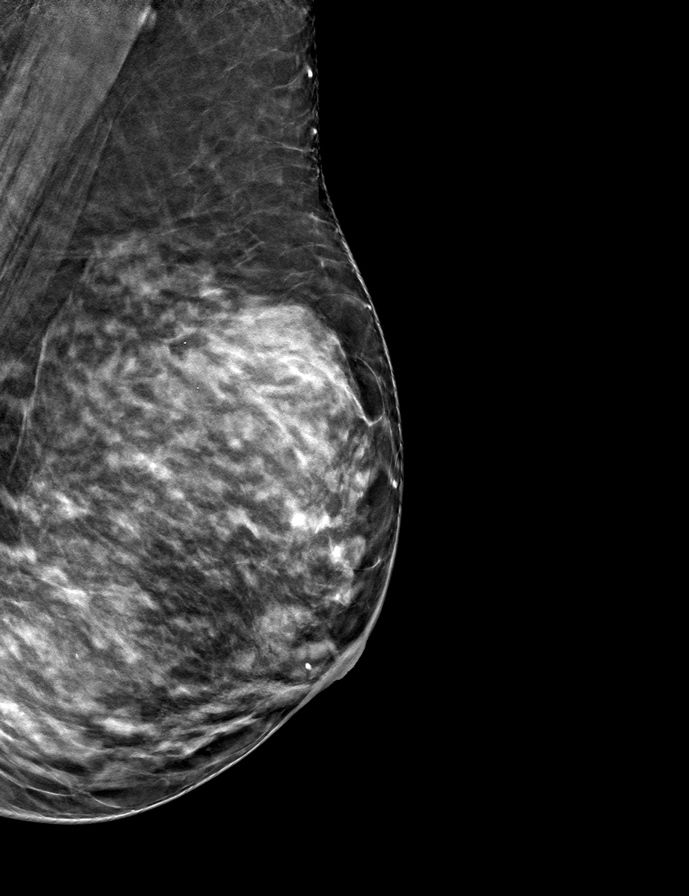

[R MLO tomo · tomo slice 27/54.0]
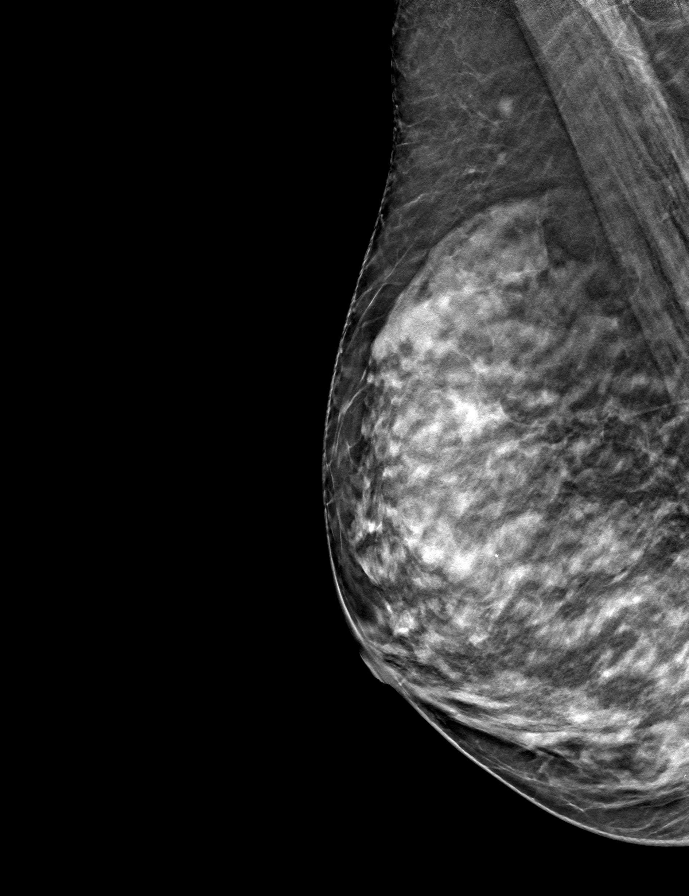

[R CC tomo · tomo slice 28/55.0]
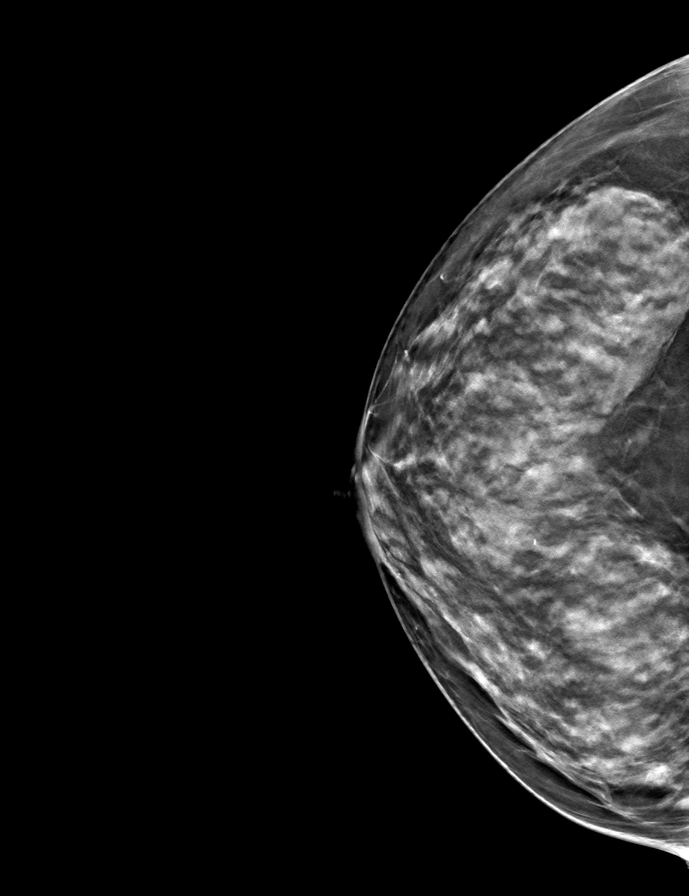

[L CC tomo · tomo slice 27/54.0]
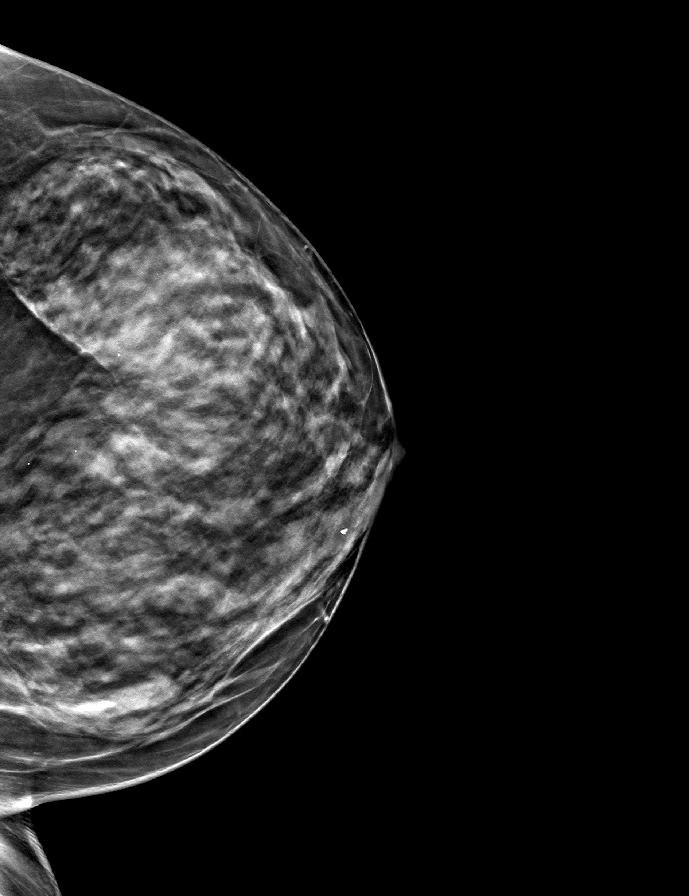

[9 of 24 positions shown; findings below may reference images not displayed]

ACR Breast Density Category c: The breast tissue is heterogeneously
dense, which may obscure small masses.
FINDINGS: There are no findings suspicious for malignancy.
IMPRESSION: No mammographic evidence of malignancy. A result letter of this
screening mammogram will be mailed directly to the patient.

RECOMMENDATION:
Screening mammogram in one year. (Code:Q3-W-BC3)

BI-RADS CATEGORY  1: Negative.

## 2023-12-09 DIAGNOSIS — Z1331 Encounter for screening for depression: Secondary | ICD-10-CM | POA: Diagnosis not present

## 2023-12-09 DIAGNOSIS — K219 Gastro-esophageal reflux disease without esophagitis: Secondary | ICD-10-CM | POA: Diagnosis not present

## 2023-12-09 DIAGNOSIS — M810 Age-related osteoporosis without current pathological fracture: Secondary | ICD-10-CM | POA: Diagnosis not present

## 2023-12-09 DIAGNOSIS — E785 Hyperlipidemia, unspecified: Secondary | ICD-10-CM | POA: Diagnosis not present

## 2023-12-09 DIAGNOSIS — E559 Vitamin D deficiency, unspecified: Secondary | ICD-10-CM | POA: Diagnosis not present

## 2023-12-09 DIAGNOSIS — Z0000 Encounter for general adult medical examination without abnormal findings: Secondary | ICD-10-CM | POA: Diagnosis not present

## 2023-12-09 DIAGNOSIS — I10 Essential (primary) hypertension: Secondary | ICD-10-CM | POA: Diagnosis not present

## 2023-12-09 DIAGNOSIS — Z79899 Other long term (current) drug therapy: Secondary | ICD-10-CM | POA: Diagnosis not present

## 2023-12-09 DIAGNOSIS — Z720 Tobacco use: Secondary | ICD-10-CM | POA: Diagnosis not present

## 2023-12-09 DIAGNOSIS — I2510 Atherosclerotic heart disease of native coronary artery without angina pectoris: Secondary | ICD-10-CM | POA: Diagnosis not present

## 2023-12-10 ENCOUNTER — Other Ambulatory Visit: Payer: Self-pay | Admitting: Internal Medicine

## 2023-12-10 DIAGNOSIS — M810 Age-related osteoporosis without current pathological fracture: Secondary | ICD-10-CM

## 2023-12-22 DIAGNOSIS — H2513 Age-related nuclear cataract, bilateral: Secondary | ICD-10-CM | POA: Diagnosis not present

## 2024-03-23 ENCOUNTER — Other Ambulatory Visit: Payer: Self-pay | Admitting: Cardiovascular Disease

## 2024-08-08 ENCOUNTER — Other Ambulatory Visit: Payer: Medicare HMO

## 2024-08-11 ENCOUNTER — Ambulatory Visit (HOSPITAL_BASED_OUTPATIENT_CLINIC_OR_DEPARTMENT_OTHER)
Admission: RE | Admit: 2024-08-11 | Discharge: 2024-08-11 | Disposition: A | Discharge status: Home / Self Care | Source: Ambulatory Visit | Attending: Internal Medicine | Admitting: Internal Medicine

## 2024-08-11 DIAGNOSIS — Z780 Asymptomatic menopausal state: Secondary | ICD-10-CM | POA: Diagnosis not present

## 2024-08-11 DIAGNOSIS — M810 Age-related osteoporosis without current pathological fracture: Secondary | ICD-10-CM | POA: Diagnosis not present

## 2024-09-17 ENCOUNTER — Other Ambulatory Visit: Payer: Self-pay | Admitting: Cardiovascular Disease

## 2024-09-17 DIAGNOSIS — I739 Peripheral vascular disease, unspecified: Secondary | ICD-10-CM

## 2024-09-17 DIAGNOSIS — I10 Essential (primary) hypertension: Secondary | ICD-10-CM

## 2024-10-29 ENCOUNTER — Other Ambulatory Visit: Payer: Self-pay | Admitting: Cardiovascular Disease

## 2024-11-07 ENCOUNTER — Encounter: Payer: Self-pay | Admitting: Cardiovascular Disease

## 2024-11-07 ENCOUNTER — Ambulatory Visit: Attending: Cardiovascular Disease | Admitting: Cardiovascular Disease

## 2024-11-07 VITALS — BP 142/90 | HR 78 | Ht 67.0 in | Wt 142.2 lb

## 2024-11-07 DIAGNOSIS — I2510 Atherosclerotic heart disease of native coronary artery without angina pectoris: Secondary | ICD-10-CM

## 2024-11-07 DIAGNOSIS — I10 Essential (primary) hypertension: Secondary | ICD-10-CM

## 2024-11-07 DIAGNOSIS — E782 Mixed hyperlipidemia: Secondary | ICD-10-CM | POA: Diagnosis not present

## 2024-11-07 LAB — LIPID PANEL

## 2024-11-07 MED ORDER — NITROGLYCERIN 0.4 MG SL SUBL: 0.4000 mg | SUBLINGUAL_TABLET | SUBLINGUAL | 3 refills | Status: AC | PRN

## 2024-11-07 NOTE — Progress Notes (Unsigned)
 " Cardiology Office Note:    Date:  11/07/2024   ID:  Melissa Nixon, DOB 1946/05/03, MRN [id number]  PCP:  [facility]   [facility] Providers Cardiologist:  Ozell Fell, MD     Referring MD: [facility]   Chief Complaint  Patient presents with   Coronary Artery Disease    History of Present Illness:    Melissa Nixon is a 79 y.o. female with a hx of coronary artery disease, presented for follow-up evaluation.  The patient initially presented with an anterior wall MI in 2015, treated with primary PCI of the LAD using a drug-eluting stent.  Postinfarction LVEF was normal on follow-up cardiac imaging.  CV problems include hypertension, hyperlipidemia, and tobacco abuse.  The patient is here alone today. She is doing fairly well. States that she 'stays in' most of the time. She reports shortness of breath with moderate exertion, but no symptoms with her day to day activities. No leg swelling, orthopnea, or PND. No cough.  No chest pain.  She states her BP usually runs 120/80 mmHg, but that today was stressful for her - she initially went to our old office for this appointment.   Current Medications: Active Medications[1]   Allergies:   Brilinta  [ticagrelor ], Atorvastatin , Lisinopril , Mushroom extract complex (obsolete), Other, and Tetracyclines & related   ROS:   Please see the history of present illness.    All other systems reviewed and are negative.  EKGs/Labs/Other Studies Reviewed:    The following studies were reviewed today:     EKG:        Recent Labs: No results found for requested labs within last 365 days.  Recent Lipid Panel    Component Value Date/Time   CHOL 119 07/09/2023 1429   TRIG 166 (H) 07/09/2023 1429   HDL 45 07/09/2023 1429   CHOLHDL 2.6 07/09/2023 1429   CHOLHDL 3 08/21/2016 1024   VLDL 34.8 08/21/2016 1024   LDLCALC 46 07/09/2023 1429   LDLDIRECT 138.4 07/16/2012 1345            Physical Exam:    VS:  BP (!) 142/90 (BP Location: Left Arm, Patient Position: Sitting, Cuff Size: Normal)   Pulse 78   Ht 5' 7 (1.702 m)   Wt 142 lb 3.2 oz (64.5 kg)   SpO2 96%   BMI 22.27 kg/m     Wt Readings from Last 3 Encounters:  11/07/24 142 lb 3.2 oz (64.5 kg)  10/29/23 140 lb 12.8 oz (63.9 kg)  11/10/22 144 lb (65.3 kg)     GEN:  Well nourished, well developed in no acute distress HEENT: Normal NECK: No JVD; No carotid bruits LYMPHATICS: No lymphadenopathy CARDIAC: RRR, no murmurs, rubs, gallops RESPIRATORY:  Clear to auscultation without rales, wheezing or rhonchi  ABDOMEN: Soft, non-tender, non-distended MUSCULOSKELETAL:  No edema; No deformity  SKIN: Warm and dry NEUROLOGIC:  Alert and oriented x 3 PSYCHIATRIC:  Normal affect   Assessment & Plan Coronary artery disease involving native coronary artery of native heart without angina pectoris Stable without symptoms of angina.  Continue aspirin  for antiplatelet therapy, metoprolol , and rosuvastatin . Mixed hyperlipidemia Treated with rosuvastatin .  Will update her labs to include a CBC, comprehensive metabolic panel, and lipid panel. Essential hypertension Home blood pressures have been well-controlled.  Today's elevated reading is likely related to stress as detailed in the HPI.  I reviewed her blood pressure readings over time and they are generally  in an acceptable range.  Continue metoprolol  25 mg twice daily.            Medication Adjustments/Labs and Tests Ordered: Current medicines are reviewed at length with the patient today.  Concerns regarding medicines are outlined above.  Orders Placed This Encounter  Procedures   CBC   Comprehensive metabolic panel with GFR   Lipid panel   Meds ordered this encounter  Medications   nitroGLYCERIN  (NITROSTAT ) 0.4 MG SL tablet    Sig: Place 1 tablet (0.4 mg total) under the tongue every 5 (five) minutes x 3 doses as needed for chest pain.     Dispense:  25 tablet    Refill:  3    Patient Instructions  Medication Instructions:  No medication changes were made at this visit. Continue current regimen.   *If you need a refill on your cardiac medications before your next appointment, please call your pharmacy*  Lab Work: To be completed today: CBC, CMP, and lipid panel  If you have labs (blood work) drawn today and your tests are completely normal, you will receive your results only by: MyChart Message (if you have MyChart) OR A paper copy in the mail If you have any lab test that is abnormal or we need to change your treatment, we will call you to review the results.  Testing/Procedures: None ordered today.  Follow-Up: At [facility], you and your health needs are our priority.  As part of our continuing mission to provide you with exceptional heart care, our providers are all part of one team.  This team includes your primary Cardiologist (physician) and Advanced Practice Providers or APPs (Physician Assistants and Nurse Practitioners) who all work together to provide you with the care you need, when you need it.  Your next appointment:   1 year(s)  Provider:   Ozell Fell, MD     Signed, Ozell Fell, MD  11/07/2024 3:05 PM    [facility]     [1]  Current Meds  Medication Sig   alendronate  (FOSAMAX ) 10 MG tablet Take 10 mg by mouth daily before breakfast. Take with a full glass of water on an empty stomach.   aspirin  81 MG tablet Take 81 mg by mouth daily.   Cyanocobalamin (VITAMIN B12) 1000 MCG TBCR Take 1 tablet by mouth every other day.   furosemide  (LASIX ) 20 MG tablet TAKE 1 TABLET EVERY DAY   KRILL OIL PO Take 4,000 mg by mouth daily.   Loratadine-Pseudoephedrine (CLARITIN-D 12 HOUR PO) Take 1 tablet by mouth daily.    metoprolol  tartrate (LOPRESSOR ) 25 MG tablet TAKE 1 TABLET TWICE DAILY   nitroGLYCERIN  (NITROSTAT ) 0.4 MG SL tablet Place 1 tablet (0.4 mg total) under the tongue  every 5 (five) minutes x 3 doses as needed for chest pain.   pantoprazole  (PROTONIX ) 20 MG tablet Take 20 mg by mouth as needed.   potassium chloride  SA (KLOR-CON  M) 20 MEQ tablet TAKE 1 TABLET EVERY DAY   rosuvastatin  (CRESTOR ) 5 MG tablet TAKE 1 TABLET EVERY DAY   VITAMIN D PO Take 2,000 mg by mouth daily.   [DISCONTINUED] nitroGLYCERIN  (NITROSTAT ) 0.4 MG SL tablet Place 1 tablet (0.4 mg total) under the tongue every 5 (five) minutes x 3 doses as needed for chest pain.   "

## 2024-11-07 NOTE — Assessment & Plan Note (Signed)
 Treated with rosuvastatin .  Will update her labs to include a CBC, comprehensive metabolic panel, and lipid panel.

## 2024-11-07 NOTE — Patient Instructions (Signed)
 Medication Instructions:  No medication changes were made at this visit. Continue current regimen.   *If you need a refill on your cardiac medications before your next appointment, please call your pharmacy*  Lab Work: To be completed today: CBC, CMP, and lipid panel  If you have labs (blood work) drawn today and your tests are completely normal, you will receive your results only by: MyChart Message (if you have MyChart) OR A paper copy in the mail If you have any lab test that is abnormal or we need to change your treatment, we will call you to review the results.  Testing/Procedures: None ordered today.  Follow-Up: At [facility], you and your health needs are our priority.  As part of our continuing mission to provide you with exceptional heart care, our providers are all part of one team.  This team includes your primary Cardiologist (physician) and Advanced Practice Providers or APPs (Physician Assistants and Nurse Practitioners) who all work together to provide you with the care you need, when you need it.  Your next appointment:   1 year(s)  Provider:   Ozell Fell, MD

## 2024-11-07 NOTE — Assessment & Plan Note (Signed)
 Home blood pressures have been well-controlled.  Today's elevated reading is likely related to stress as detailed in the HPI.  I reviewed her blood pressure readings over time and they are generally in an acceptable range.  Continue metoprolol  25 mg twice daily.

## 2024-11-08 LAB — COMPREHENSIVE METABOLIC PANEL WITH GFR
ALT: 19 IU/L (ref 0–32)
AST: 26 IU/L (ref 0–40)
Albumin: 4.4 g/dL (ref 3.8–4.8)
Alkaline Phosphatase: 78 IU/L (ref 49–135)
BUN/Creatinine Ratio: 13 (ref 12–28)
BUN: 16 mg/dL (ref 8–27)
Bilirubin Total: 0.4 mg/dL (ref 0.0–1.2)
CO2: 24 mmol/L (ref 20–29)
Calcium: 10.2 mg/dL (ref 8.7–10.3)
Chloride: 99 mmol/L (ref 96–106)
Creatinine, Ser: 1.2 mg/dL — AB (ref 0.57–1.00)
Globulin, Total: 3.5 g/dL (ref 1.5–4.5)
Glucose: 106 mg/dL — AB (ref 70–99)
Potassium: 4.7 mmol/L (ref 3.5–5.2)
Sodium: 140 mmol/L (ref 134–144)
Total Protein: 7.9 g/dL (ref 6.0–8.5)
eGFR: 47 mL/min/1.73 — AB

## 2024-11-08 LAB — LIPID PANEL
Cholesterol, Total: 143 mg/dL (ref 100–199)
HDL: 52 mg/dL
LDL CALC COMMENT:: 2.8 ratio (ref 0.0–4.4)
LDL Chol Calc (NIH): 65 mg/dL (ref 0–99)
Triglycerides: 150 mg/dL — AB (ref 0–149)
VLDL Cholesterol Cal: 26 mg/dL (ref 5–40)

## 2024-11-08 LAB — CBC
Hematocrit: 44 % (ref 34.0–46.6)
Hemoglobin: 14.7 g/dL (ref 11.1–15.9)
MCH: 30.7 pg (ref 26.6–33.0)
MCHC: 33.4 g/dL (ref 31.5–35.7)
MCV: 92 fL (ref 79–97)
Platelets: 353 x10E3/uL (ref 150–450)
RBC: 4.79 x10E6/uL (ref 3.77–5.28)
RDW: 13.2 % (ref 11.7–15.4)
WBC: 9.2 x10E3/uL (ref 3.4–10.8)

## 2024-11-13 ENCOUNTER — Ambulatory Visit: Payer: Self-pay | Admitting: Cardiovascular Disease

## 2024-12-02 ENCOUNTER — Other Ambulatory Visit: Payer: Self-pay | Admitting: Cardiovascular Disease

## 2024-12-02 DIAGNOSIS — I10 Essential (primary) hypertension: Secondary | ICD-10-CM

## 2024-12-02 DIAGNOSIS — I739 Peripheral vascular disease, unspecified: Secondary | ICD-10-CM
